# Patient Record
Sex: Male | Born: 1980
Health system: Southern US, Community
[De-identification: ages and names within clinical notes are randomized; demographics above are authoritative.]

---

## 2005-03-06 HISTORY — PX: NASAL SINUS SURGERY: SHX719

## 2005-07-04 HISTORY — PX: SEPTOPLASTY: SHX2393

## 2017-02-06 ENCOUNTER — Ambulatory Visit (INDEPENDENT_AMBULATORY_CARE_PROVIDER_SITE_OTHER): Payer: 59 | Admitting: Family Medicine

## 2017-02-06 ENCOUNTER — Encounter: Payer: Self-pay | Admitting: Family Medicine

## 2017-02-06 VITALS — BP 138/86 | HR 69 | Ht 72.0 in | Wt 224.2 lb

## 2017-02-06 DIAGNOSIS — Z13220 Encounter for screening for lipoid disorders: Secondary | ICD-10-CM

## 2017-02-06 DIAGNOSIS — Z23 Encounter for immunization: Secondary | ICD-10-CM | POA: Diagnosis not present

## 2017-02-06 DIAGNOSIS — H61893 Other specified disorders of external ear, bilateral: Secondary | ICD-10-CM | POA: Diagnosis not present

## 2017-02-06 DIAGNOSIS — Z0001 Encounter for general adult medical examination with abnormal findings: Secondary | ICD-10-CM

## 2017-02-06 DIAGNOSIS — M549 Dorsalgia, unspecified: Secondary | ICD-10-CM | POA: Diagnosis not present

## 2017-02-06 DIAGNOSIS — Z114 Encounter for screening for human immunodeficiency virus [HIV]: Secondary | ICD-10-CM | POA: Diagnosis not present

## 2017-02-06 LAB — COMPREHENSIVE METABOLIC PANEL
ALBUMIN: 4.6 g/dL (ref 3.5–5.2)
ALK PHOS: 89 U/L (ref 39–117)
ALT: 31 U/L (ref 0–53)
AST: 31 U/L (ref 0–37)
BUN: 9 mg/dL (ref 6–23)
CO2: 28 mEq/L (ref 19–32)
Calcium: 9.8 mg/dL (ref 8.4–10.5)
Chloride: 100 mEq/L (ref 96–112)
Creatinine, Ser: 0.83 mg/dL (ref 0.40–1.50)
GFR: 133.96 mL/min (ref >60.00)
Glucose, Bld: 100 mg/dL — ABNORMAL HIGH (ref 70–99)
POTASSIUM: 3.8 meq/L (ref 3.5–5.1)
SODIUM: 136 meq/L (ref 135–145)
TOTAL PROTEIN: 7.9 g/dL (ref 6.0–8.3)
Total Bilirubin: 0.6 mg/dL (ref 0.2–1.2)

## 2017-02-06 LAB — LIPID PANEL
CHOLESTEROL: 166 mg/dL (ref 0–200)
HDL: 53.4 mg/dL (ref >39.00)
LDL Cholesterol: 96 mg/dL (ref 0–99)
NonHDL: 112.61
Total CHOL/HDL Ratio: 3
Triglycerides: 82 mg/dL (ref 0.0–149.0)
VLDL: 16.4 mg/dL (ref 0.0–40.0)

## 2017-02-06 LAB — CBC
HEMATOCRIT: 45.1 % (ref 39.0–52.0)
HEMOGLOBIN: 14.9 g/dL (ref 13.0–17.0)
MCHC: 33.1 g/dL (ref 30.0–36.0)
MCV: 89 fl (ref 78.0–100.0)
PLATELETS: 288 10*3/uL (ref 150.0–400.0)
RBC: 5.07 Mil/uL (ref 4.22–5.81)
RDW: 14.3 % (ref 11.5–15.5)
WBC: 8.8 10*3/uL (ref 4.0–10.5)

## 2017-02-06 NOTE — Patient Instructions (Signed)
Preventive Care 18-39 Years, Male Preventive care refers to lifestyle choices and visits with your health care provider that can promote health and wellness. What does preventive care include?  A yearly physical exam. This is also called an annual well check.  Dental exams once or twice a year.  Routine eye exams. Ask your health care provider how often you should have your eyes checked.  Personal lifestyle choices, including: ? Daily care of your teeth and gums. ? Regular physical activity. ? Eating a healthy diet. ? Avoiding tobacco and drug use. ? Limiting alcohol use. ? Practicing safe sex. What happens during an annual well check? The services and screenings done by your health care provider during your annual well check will depend on your age, overall health, lifestyle risk factors, and family history of disease. Counseling Your health care provider may ask you questions about your:  Alcohol use.  Tobacco use.  Drug use.  Emotional well-being.  Home and relationship well-being.  Sexual activity.  Eating habits.  Work and work environment.  Screening You may have the following tests or measurements:  Height, weight, and BMI.  Blood pressure.  Lipid and cholesterol levels. These may be checked every 5 years starting at age 20.  Diabetes screening. This is done by checking your blood sugar (glucose) after you have not eaten for a while (fasting).  Skin check.  Hepatitis C blood test.  Hepatitis B blood test.  Sexually transmitted disease (STD) testing.  Discuss your test results, treatment options, and if necessary, the need for more tests with your health care provider. Vaccines Your health care provider may recommend certain vaccines, such as:  Influenza vaccine. This is recommended every year.  Tetanus, diphtheria, and acellular pertussis (Tdap, Td) vaccine. You may need a Td booster every 10 years.  Varicella vaccine. You may need this if you  have not been vaccinated.  HPV vaccine. If you are 26 or younger, you may need three doses over 6 months.  Measles, mumps, and rubella (MMR) vaccine. You may need at least one dose of MMR.You may also need a second dose.  Pneumococcal 13-valent conjugate (PCV13) vaccine. You may need this if you have certain conditions and have not been vaccinated.  Pneumococcal polysaccharide (PPSV23) vaccine. You may need one or two doses if you smoke cigarettes or if you have certain conditions.  Meningococcal vaccine. One dose is recommended if you are age 19-21 years and a first-year college student living in a residence hall, or if you have one of several medical conditions. You may also need additional booster doses.  Hepatitis A vaccine. You may need this if you have certain conditions or if you travel or work in places where you may be exposed to hepatitis A.  Hepatitis B vaccine. You may need this if you have certain conditions or if you travel or work in places where you may be exposed to hepatitis B.  Haemophilus influenzae type b (Hib) vaccine. You may need this if you have certain risk factors.  Talk to your health care provider about which screenings and vaccines you need and how often you need them. This information is not intended to replace advice given to you by your health care provider. Make sure you discuss any questions you have with your health care provider. Document Released: 04/18/2001 Document Revised: 11/10/2015 Document Reviewed: 12/22/2014 Elsevier Interactive Patient Education  2017 Elsevier Inc.  

## 2017-02-06 NOTE — Progress Notes (Signed)
Subjective:  Francisco Elliott is a 36 y.o. male who presents today for his annual comprehensive physical exam.    HPI:  [place]oved [place] from [place] within the last few months.  Previously lived in [place] prior to this.  Acute complaints: 1.  Back pain.  Several month history.  Occurs after eating food.  Usually last for an hour or 2 however had an episode 2 weeks ago where it lasted for a couple of days.  Notes that it usually happens after eating fatty or greasy food.  No chest pain or shortness of breath.  No abdominal pain.  No nausea or vomiting.  No constipation or diarrhea.  No treatments tried.  He is concerned that it may be his gallbladder.  Preventative healthcare:  Lifestyle Diet: Eating more salads.  Trying to eat more lean meats. Exercise: Go on walks. Recently bought a [employer] and will be trying to work out more at home.  Depression screen PHQ 2/9 02/06/2017  Decreased Interest 0  Down, Depressed, Hopeless 0  PHQ - 2 Score 0    Health Maintenance Due  Topic Date Due  . HIV Screening  12/12/1994  . TETANUS/TDAP  12/12/1998  . INFLUENZA VACCINE  10/04/2016    ROS: Per HPI, otherwise a 14 point review of systems was performed and was negative  PMH:  The following were reviewed and entered/updated in epic: History reviewed. No pertinent past medical history. There are no active problems to display for this patient.  Past Surgical History:  Procedure Laterality Date  . NASAL SINUS SURGERY  2007  . SEPTOPLASTY  07/04/2005   Family History  Problem Relation Age of Onset  . Hypertension Mother    Medications- reviewed and updated No current outpatient medications on file.   No current facility-administered medications for this visit.    Allergies-reviewed and updated No Known Allergies   Social History   Socioeconomic History  . Marital status: Married    Spouse name: None  . Number of children: 4  . Years of education: None  .  Highest education level: None  Social Needs  . Financial resource strain: None  . Food insecurity - worry: None  . Food insecurity - inability: None  . Transportation needs - medical: None  . Transportation needs - non-medical: None  Occupational History  . Occupation: Radio producerndepedent Contractor   Tobacco Use  . Smoking status: Never Smoker  . Smokeless tobacco: Never Used  Substance and Sexual Activity  . Alcohol use: No    Frequency: Never  . Drug use: No  . Sexual activity: Yes  Other Topics Concern  . None  Social History Narrative  . None    Objective:  Physical Exam: BP 138/86   Pulse 69   Ht 6' (1.829 m)   Wt 224 lb 3.2 oz (101.7 kg)   SpO2 98%   BMI 30.41 kg/m   Body mass index is 30.41 kg/m. Gen: NAD, resting comfortably HEENT: EAC with mild amount of irritation bilaterally.  TMs normal bilaterally. OP clear. No thyromegaly noted.  CV: RRR with no murmurs appreciated Pulm: NWOB, CTAB with no crackles, wheezes, or rhonchi GI: Normal bowel sounds present. Soft, Nontender, Nondistended. MSK: no edema, cyanosis, or clubbing noted -Back: No deformities.  Nontender to palpation. Skin: warm, dry Neuro: CN2-12 grossly intact. Strength 5/5 in upper and lower extremities. Reflexes symmetric and intact bilaterally.  Psych: Normal affect and thought content  Assessment/Plan:  Back pain Unclear  etiology.  Possibly gallbladder related, however his abdominal exam is benign and this would be an atypical presentation.  He does not have any signs or symptoms concerning for pulmonary or cardiac disease.  Continue with watchful waiting.  If symptoms worsen or do not improve, would consider right upper quadrant ultrasound to rule out gallstone.  EAC irritation Advised patient to avoid Q-tip use.  Recommended Debrox or hydrogen peroxide as needed to help reduce Ehrmann burden.  Preventative Healthcare: Flu shot given today.  Tdap given today.  Check screening labs including lipid  panel, HIV, CBC, and CMET.  Patient Counseling:  -Nutrition: Stressed importance of moderation in sodium/caffeine intake, saturated fat and cholesterol, caloric balance, sufficient intake of fresh fruits, vegetables, and fiber.  -Stressed the importance of regular exercise.   -Substance Abuse: Discussed cessation/primary prevention of tobacco, alcohol, or other drug use; driving or other dangerous activities under the influence; availability of treatment for abuse.   -Injury prevention: Discussed safety belts, safety helmets, smoke detector, smoking near bedding or upholstery.   -Sexuality: Discussed sexually transmitted diseases, partner selection, use of condoms, avoidance of unintended pregnancy and contraceptive alternatives.   -Dental health: Discussed importance of regular tooth brushing, flossing, and dental visits.  -Health maintenance and immunizations reviewed. Please refer to Health maintenance section.  Return to care in 1 year for next preventative visit.   Katina Degreealeb M. Jimmey RalphParker, MD 02/06/2017 12:28 PM

## 2017-02-07 LAB — HIV ANTIBODY (ROUTINE TESTING W REFLEX): HIV 1&2 Ab, 4th Generation: NONREACTIVE

## 2017-02-08 NOTE — Progress Notes (Signed)
Labs all normal. Please inform patient.

## 2017-02-20 DIAGNOSIS — Z6830 Body mass index (BMI) 30.0-30.9, adult: Secondary | ICD-10-CM | POA: Insufficient documentation

## 2017-12-17 ENCOUNTER — Encounter: Payer: Self-pay | Admitting: Nurse Practitioner

## 2017-12-17 NOTE — Patient Instructions (Addendum)
Immunization status:influenza was given 12/17/2017, pt. Given VIS form 

## 2018-01-19 ENCOUNTER — Ambulatory Visit (INDEPENDENT_AMBULATORY_CARE_PROVIDER_SITE_OTHER): Payer: Self-pay | Admitting: Physician Assistant

## 2018-01-19 VITALS — BP 124/84 | HR 94 | Temp 98.6°F | Resp 20 | Wt 223.8 lb

## 2018-01-19 DIAGNOSIS — R509 Fever, unspecified: Secondary | ICD-10-CM

## 2018-01-19 DIAGNOSIS — J069 Acute upper respiratory infection, unspecified: Secondary | ICD-10-CM

## 2018-01-19 MED ORDER — FLUTICASONE PROPIONATE 50 MCG/ACT NA SUSP: 2.0000 | Freq: Every day | NASAL | 0 refills | Status: DC

## 2018-01-19 MED ORDER — CETIRIZINE HCL 10 MG PO TABS: 10.0000 mg | ORAL_TABLET | Freq: Every day | ORAL | 0 refills | Status: DC

## 2018-01-19 MED ORDER — LEVOFLOXACIN 500 MG PO TABS: 500.0000 mg | ORAL_TABLET | Freq: Every day | ORAL | 0 refills | Status: DC

## 2018-01-19 MED ORDER — BENZONATATE 100 MG PO CAPS: 200.0000 mg | ORAL_CAPSULE | Freq: Three times a day (TID) | ORAL | 0 refills | Status: DC | PRN

## 2018-01-19 NOTE — Progress Notes (Addendum)
.      Acute Office Visit  Subjective:    Patient ID: Francisco Elliott, male    DOB: [birth year], 37 y.o.   MRN: [id number]  Chief Complaint  Patient presents with  . congestion,fever    HPI Patient is in today for cough and fever. States cough started 5 days ago and is productive of green sputum. Fever for the past 2 days, as high as 102. Has has associated nasal congestion, sinus headache, runny nose and sneezing.  Has been taking Theraflu with Tylenol, last dose this morning. Denies any body ache, cp, dyspnea, abd pain, n, v. States through his job, has had sick contacts. Denies any recent travel.  No past medical history on file.   Past Surgical History:  Procedure Laterality Date  . NASAL SINUS SURGERY  2007  . SEPTOPLASTY  07/04/2005    Family History  Problem Relation Age of Onset  . Hypertension Mother     Social History   Socioeconomic History  . Marital status: Married    Spouse name: Not on file  . Number of children: 4  . Years of education: Not on file  . Highest education level: Not on file  Occupational History  . Occupation: Radio producer   Social Needs  . Financial resource strain: Not on file  . Food insecurity:    Worry: Not on file    Inability: Not on file  . Transportation needs:    Medical: Not on file    Non-medical: Not on file  Tobacco Use  . Smoking status: Never Smoker  . Smokeless tobacco: Never Used  Substance and Sexual Activity  . Alcohol use: No    Frequency: Never  . Drug use: No  . Sexual activity: Yes  Lifestyle  . Physical activity:    Days per week: Not on file    Minutes per session: Not on file  . Stress: Not on file  Relationships  . Social connections:    Talks on phone: Not on file    Gets together: Not on file    Attends religious service: Not on file    Active member of club or organization: Not on file    Attends meetings of clubs or organizations: Not on file    Relationship status: Not on file  .  Intimate partner violence:    Fear of current or ex partner: Not on file    Emotionally abused: Not on file    Physically abused: Not on file    Forced sexual activity: Not on file  Other Topics Concern  . Not on file  Social History Narrative  . Not on file    No outpatient medications prior to visit.   No facility-administered medications prior to visit.     No Known Allergies  Review of Systems  Constitutional: Positive for fever.  HENT: Positive for congestion. Negative for sinus pain and sore throat.        Runny nose, and sneezing  Respiratory: Positive for cough and sputum production.   Cardiovascular: Negative for chest pain.  Gastrointestinal: Negative for abdominal pain, nausea and vomiting.  Musculoskeletal: Negative for myalgias.  Neurological: Positive for headaches.       Objective:    Physical Exam  Constitutional: He is oriented to person, place, and time. He appears well-developed and well-nourished.  HENT:  Right Ear: External ear and ear canal normal. Tympanic membrane is not erythematous.  Left Ear: External ear and ear canal normal.  Tympanic membrane is not erythematous.  Nose: Nose normal.  Mouth/Throat: Uvula is midline and oropharynx is clear and moist. No oropharyngeal exudate, posterior oropharyngeal edema or posterior oropharyngeal erythema.  Eyes: Pupils are equal, round, and reactive to light. Conjunctivae are normal.  Neck: Normal range of motion. Neck supple.  Cardiovascular: Normal rate and regular rhythm.  Pulmonary/Chest: Effort normal. No respiratory distress. He has no wheezes. He has rales in the left upper field. He exhibits no tenderness.  Abdominal: There is no tenderness.  Musculoskeletal: Normal range of motion.  Neurological: He is alert and oriented to person, place, and time.  Skin: Skin is warm and dry.  Psychiatric: He has a normal mood and affect.    BP 124/84 (BP Location: Right Arm, Patient Position: Sitting, Cuff  Size: Normal)   Pulse 94   Temp 98.6 F (37 C) (Oral)   Resp 20   Wt 223 lb 12.8 oz (101.5 kg)   SpO2 96%   BMI 30.35 kg/m  Wt Readings from Last 3 Encounters:  01/19/18 223 lb 12.8 oz (101.5 kg)  02/06/17 224 lb 3.2 oz (101.7 kg)    There are no preventive care reminders to display for this patient.  There are no preventive care reminders to display for this patient.   No results found for: TSH Lab Results  Component Value Date   WBC 8.8 02/06/2017   HGB 14.9 02/06/2017   HCT 45.1 02/06/2017   MCV 89.0 02/06/2017   PLT 288.0 02/06/2017   Lab Results  Component Value Date   NA 136 02/06/2017   K 3.8 02/06/2017   CO2 28 02/06/2017   GLUCOSE 100 (H) 02/06/2017   BUN 9 02/06/2017   CREATININE 0.83 02/06/2017   BILITOT 0.6 02/06/2017   ALKPHOS 89 02/06/2017   AST 31 02/06/2017   ALT 31 02/06/2017   PROT 7.9 02/06/2017   ALBUMIN 4.6 02/06/2017   CALCIUM 9.8 02/06/2017   GFR 133.96 02/06/2017   Lab Results  Component Value Date   CHOL 166 02/06/2017   Lab Results  Component Value Date   HDL 53.40 02/06/2017   Lab Results  Component Value Date   LDLCALC 96 02/06/2017   Lab Results  Component Value Date   TRIG 82.0 02/06/2017   Lab Results  Component Value Date   CHOLHDL 3 02/06/2017   No results found for: HGBA1C     Assessment & Plan:   Problem List Items Addressed This Visit    None    Visit Diagnoses    Upper respiratory tract infection, unspecified type    -  Primary   Fever, unspecified fever cause         URI -Symptoms indicate URI, however, lung examination with rales in the left upper lung field, sputum production, and fever can indicate pneumonia and will treat with indicated antibiotic, Levofloxacin -Take Flonase, Zyrtec, and Tessalon for suppurative care - If you develop chest pain, shortness of breath, or if you are unable to tolerate the prescribed antibiotics, go to the ER  Fever -Likely due to pneumonia. -Since lacking other  symptoms of the flu such as headache and body ache did not test for Influenza. -Take Ibuprofen and Tylenol.  -Stay hydrated. -If persists despite treatment, of if you develop any new symptoms, return to the clinic or go to the ER   Meds ordered this encounter  Medications  . benzonatate (TESSALON) 100 MG capsule    Sig: Take 2 capsules (200 mg total) by  mouth 3 (three) times daily as needed for cough.    Dispense:  20 capsule    Refill:  0    Order Specific Question:   Supervising Provider    Answer:   Rene PaciLESCHBER, VALERIE A [[id number]]  . fluticasone (FLONASE) 50 MCG/ACT nasal spray    Sig: Place 2 sprays into both nostrils daily.    Dispense:  16 g    Refill:  0    Order Specific Question:   Supervising Provider    Answer:   Rene PaciLESCHBER, VALERIE A [[id number]]  . cetirizine (ZYRTEC) 10 MG tablet    Sig: Take 1 tablet (10 mg total) by mouth daily.    Dispense:  30 tablet    Refill:  0    Order Specific Question:   Supervising Provider    Answer:   Rene PaciLESCHBER, VALERIE A [[id number]]  . levofloxacin (LEVAQUIN) 500 MG tablet    Sig: Take 1 tablet (500 mg total) by mouth daily.    Dispense:  7 tablet    Refill:  0    Order Specific Question:   Supervising Provider    Answer:   Rene PaciLESCHBER, VALERIE A [[id number]]     Demetrio LappingSahar M Christa Fasig, PA-C

## 2018-01-19 NOTE — Patient Instructions (Signed)
Aspiration Pneumonia °Aspiration pneumonia is an infection in your lungs. It occurs when food, liquid, or stomach contents (vomit) are inhaled (aspirated) into your lungs. When these things get into your lungs, swelling (inflammation) and infection can occur. This can make it difficult for you to breathe. Aspiration pneumonia is a serious condition and can be life threatening. °What increases the risk? °Aspiration pneumonia is more likely to occur when a person's cough (gag) reflex or ability to swallow has been decreased. Some things that can do this include: °· Having a brain injury or disease, such as stroke, seizures, Parkinson's disease, dementia, or amyotrophic lateral sclerosis (ALS). °· Being given general anesthetic for procedures. °· Being in a coma (unconscious). °· Having a narrowing of the tube that carries food to the stomach (esophagus). °· Drinking too much alcohol. If a person passes out and vomits, vomit can be swallowed into the lungs. °· Taking certain medicines, such as tranquilizers or sedatives. ° °What are the signs or symptoms? °· Coughing after swallowing food or liquids. °· Breathing problems, such as wheezing or shortness of breath. °· Bluish skin. This can be caused by lack of oxygen. °· Coughing up food or mucus. The mucus might contain blood, greenish material, or yellowish-white fluid (pus). °· Fever. °· Chest pain. °· Being more tired than usual (fatigue). °· Sweating more than usual. °· Bad breath. °How is this diagnosed? °A physical exam will be done. During the exam, the health care provider will listen to your lungs with a stethoscope to check for: °· Crackling sounds in the lungs. °· Decreased breath sounds. °· A rapid heartbeat. ° °Various tests may be ordered. These may include: °· Chest X-ray. °· CT scan. °· Swallowing study. This test looks at how food is swallowed and whether it goes into your breathing tube (trachea) or food pipe (esophagus). °· Sputum culture. Saliva and  mucus (sputum) are collected from the lungs or the tubes that carry air to the lungs (bronchi). The sputum is then tested for bacteria. °· Bronchoscopy. This test uses a flexible tube (bronchoscope) to see inside the lungs. ° °How is this treated? °Treatment will usually include antibiotic medicines. Other medicines may also be used to reduce fever or pain. You may need to be treated in the hospital. In the hospital, your breathing will be carefully monitored. Depending on how well you are breathing, you may need to be given oxygen, or you may need breathing support from a breathing machine (ventilator). For people who fail a swallowing study, a feeding tube might be placed in the stomach, or they may be asked to avoid certain food textures or liquids when they eat. °Follow these instructions at home: °· Carefully follow any special eating instructions you were given, such as avoiding certain food textures or thickening liquids. This reduces the risk of developing aspiration pneumonia again. °· Only take over-the-counter or prescription medicines as directed by your health care provider. Follow the directions carefully. °· If you were prescribed antibiotics, take them as directed. Finish them even if you start to feel better. °· Rest as instructed by your health care provider. °· Keep all follow-up appointments with your health care provider. °Contact a health care provider if: °· You develop worsening shortness of breath, wheezing, or difficulty breathing. °· You develop a fever. °· You have chest pain. °This information is not intended to replace advice given to you by your health care provider. Make sure you discuss any questions you have with your health care   provider. °Document Released: 12/18/2008 Document Revised: 08/04/2015 Document Reviewed: 08/08/2012 °Elsevier Interactive Patient Education © 2017 [employer] Inc. ° °

## 2018-01-21 ENCOUNTER — Telehealth: Payer: Self-pay

## 2018-01-21 NOTE — Telephone Encounter (Signed)
Patient state he is getting better slowly, he was not able to go to work, but he feel s better that he did on Saturday, he will call back tomorrow 01/22/2018 if he has not improved

## 2018-05-16 ENCOUNTER — Other Ambulatory Visit: Payer: Self-pay

## 2018-05-16 ENCOUNTER — Ambulatory Visit (INDEPENDENT_AMBULATORY_CARE_PROVIDER_SITE_OTHER): Payer: Self-pay | Admitting: Nurse Practitioner

## 2018-05-16 VITALS — BP 110/75 | HR 71 | Temp 98.3°F | Resp 18 | Wt 209.0 lb

## 2018-05-16 DIAGNOSIS — B9689 Other specified bacterial agents as the cause of diseases classified elsewhere: Secondary | ICD-10-CM

## 2018-05-16 DIAGNOSIS — J019 Acute sinusitis, unspecified: Secondary | ICD-10-CM

## 2018-05-16 NOTE — Progress Notes (Signed)
See downtime forms under the media tab

## 2018-05-20 ENCOUNTER — Telehealth: Payer: Self-pay

## 2018-05-20 NOTE — Telephone Encounter (Signed)
Patient states he is feeling a little bit better, still feel with body aches and cough. I will call patient back after I speak with the provider and tell the patient if he needs to change something.

## 2018-05-20 NOTE — Telephone Encounter (Signed)
I told the patient he needs to follow up with his PCP after hi finish the treatment prescribed by the provider at this location. This were the recommendations of the provider.

## 2018-05-25 ENCOUNTER — Telehealth: Payer: 59 | Admitting: Family

## 2018-05-25 DIAGNOSIS — R05 Cough: Secondary | ICD-10-CM | POA: Diagnosis not present

## 2018-05-25 DIAGNOSIS — R059 Cough, unspecified: Secondary | ICD-10-CM

## 2018-05-25 MED ORDER — BENZONATATE 200 MG PO CAPS: 200.0000 mg | ORAL_CAPSULE | Freq: Three times a day (TID) | ORAL | 0 refills | Status: DC | PRN

## 2018-05-25 MED ORDER — ALBUTEROL SULFATE 108 (90 BASE) MCG/ACT IN AEPB: 2.0000 | INHALATION_SPRAY | RESPIRATORY_TRACT | 2 refills | Status: AC | PRN

## 2018-05-25 NOTE — Progress Notes (Signed)
Greater than 5 minutes, yet less than 10 minutes of time have been spent researching, coordinating, and implementing care for this patient today.  Thank you for the details you included in the comment boxes. Those details are very helpful in determining the best course of treatment for you and help [place] to provide the best care.  As it is a dry cough and no fever, this is likely irritation in your lungs from the viral infection you had before. We need to treat your cough and breathing ability while you recover. We would normally consider steroids, yet if you do come into contact with the Coronavirus (not meeting ANY symptoms of this now, don't worry), the prednisone can make it much worse, so we try not to give that.  That being said, you should improve soon and should aim to take steam showers along with the plan below.   We are sorry that you are not feeling well.  Here is how we plan to help!  Based on your presentation I believe you most likely have A cough due to a virus.  This is called viral bronchitis and is best treated by rest, plenty of fluids and control of the cough.  You may use Ibuprofen or Tylenol as directed to help your symptoms.     In addition you may use A non-prescription cough medication called Mucinex DM: take 2 tablets every 12 hours. and A prescription cough medication called Tessalon Perles 100mg . You may take 1-2 capsules every 8 hours as needed for your cough.  I have also added an Albuterol inhaler, take 2 puffs every 6 hours as needed for shortness of breath.    From your responses in the eVisit questionnaire you describe inflammation in the upper respiratory tract which is causing a significant cough.  This is commonly called Bronchitis and has four common causes:    Allergies  Viral Infections  Acid Reflux  Bacterial Infection Allergies, viruses and acid reflux are treated by controlling symptoms or eliminating the cause. An example might be a cough caused by  taking certain blood pressure medications. You stop the cough by changing the medication. Another example might be a cough caused by acid reflux. Controlling the reflux helps control the cough.  USE OF BRONCHODILATOR ("RESCUE") INHALERS: There is a risk from using your bronchodilator too frequently.  The risk is that over-reliance on a medication which only relaxes the muscles surrounding the breathing tubes can reduce the effectiveness of medications prescribed to reduce swelling and congestion of the tubes themselves.  Although you feel brief relief from the bronchodilator inhaler, your asthma may actually be worsening with the tubes becoming more swollen and filled with mucus.  This can delay other crucial treatments, such as oral steroid medications. If you need to use a bronchodilator inhaler daily, several times per day, you should discuss this with your provider.  There are probably better treatments that could be used to keep your asthma under control.     HOME CARE . Only take medications as instructed by your medical team. . Complete the entire course of an antibiotic. . Drink plenty of fluids and get plenty of rest. . Avoid close contacts especially the very young and the elderly . Cover your mouth if you cough or cough into your sleeve. . Always remember to wash your hands . A steam or ultrasonic humidifier can help congestion.   GET HELP RIGHT AWAY IF: . You develop worsening fever. . You become short of breath .  You cough up blood. . Your symptoms persist after you have completed your treatment plan MAKE SURE YOU   Understand these instructions.  Will watch your condition.  Will get help right away if you are not doing well or get worse.  Your e-visit answers were reviewed by a board certified advanced clinical practitioner to complete your personal care plan.  Depending on the condition, your plan could have included both over the counter or prescription medications. If  there is a problem please reply  once you have received a response from your provider. Your safety is important to [place].  If you have drug allergies check your prescription carefully.    You can use MyChart to ask questions about today's visit, request a non-urgent call back, or ask for a work or school excuse for 24 hours related to this e-Visit. If it has been greater than 24 hours you will need to follow up with your provider, or enter a new e-Visit to address those concerns. You will get an e-mail in the next two days asking about your experience.  I hope that your e-visit has been valuable and will speed your recovery. Thank you for using e-visits.

## 2018-05-30 ENCOUNTER — Encounter: Payer: Self-pay | Admitting: Family Medicine

## 2018-05-30 ENCOUNTER — Ambulatory Visit (INDEPENDENT_AMBULATORY_CARE_PROVIDER_SITE_OTHER): Payer: 59 | Admitting: Family Medicine

## 2018-05-30 VITALS — Temp 98.1°F | Ht 72.0 in | Wt 212.0 lb

## 2018-05-30 DIAGNOSIS — R059 Cough, unspecified: Secondary | ICD-10-CM

## 2018-05-30 DIAGNOSIS — R05 Cough: Secondary | ICD-10-CM | POA: Diagnosis not present

## 2018-05-30 MED ORDER — GUAIFENESIN-CODEINE 100-10 MG/5ML PO SOLN: 5.0000 mL | Freq: Three times a day (TID) | ORAL | 0 refills | Status: DC | PRN

## 2018-05-30 MED ORDER — PREDNISONE 50 MG PO TABS: ORAL_TABLET | ORAL | 0 refills | Status: DC

## 2018-05-30 MED ORDER — DOXYCYCLINE HYCLATE 100 MG PO TABS: 100.0000 mg | ORAL_TABLET | Freq: Two times a day (BID) | ORAL | 0 refills | Status: DC

## 2018-05-30 NOTE — Progress Notes (Signed)
    Chief Complaint:  Francisco Elliott is a 38 y.o. male who presents today for a virtual office visit with a chief complaint of cough.   Assessment/Plan:  Cough Most consistent with post infectious cough.  Appears that he had a flulike illness 2 weeks ago.  Will start guaifenesin-codeine cough syrup.  Also start prednisone 50 mg daily x5 days then 25 mg daily x2 days.  Also send in a "pocket prescription" for doxycycline to cover for any potential bacterial superinfections if symptoms worsen or do not improve in the next 2 days.  Encouraged good oral hydration.  Discussed reasons to return to care.  Follow-up as needed.    Subjective:  HPI:  Cough Symptoms started 2 weeks ago.  Initially had fever but has since resolved.  His wife was diagnosed with flu and her symptoms lasted for 4 to 5 days and then resolved.  He has had persistent cough over the last few weeks.  He was seen at [facility] and started on a course of amoxicillin, albuterol, and Tessalon.  None of these treatments seem to have helped.  He has had muscle aches and low-grade fever.  Some shortness of breath associate with a cough as well.  Cough is keeping him up at night.  Cough is productive of sputum.  Mucinex does not seem to be helping.  No other treatments tried.  No other obvious alleviating or aggravating factors.  ROS: Per HPI  PMH: He reports that he has never smoked. He has never used smokeless tobacco. He reports that he does not drink alcohol or use drugs.      Objective/Observations  Physical Exam: Temp 98.1 F (36.7 C) (Oral)   Ht 6' (1.829 m)   Wt 212 lb (96.2 kg)   BMI 28.75 kg/m  Gen: NAD, resting comfortably HEENT: OP erythematous with no exudate. Pulm: Normal work of breathing.  Speaking in full sentences.  No apparent cough. Neuro: Grossly normal, moves all extremities Psych: Normal affect and thought content  Virtual Visit via Video   I connected with Cammie Sickle on 05/30/18 at 10:00 AM EDT by a  video enabled telemedicine application and verified that I am speaking with the correct person using two identifiers. I discussed the limitations of evaluation and management by telemedicine and the availability of in person appointments. The patient expressed understanding and agreed to proceed.   Patient location: Home Provider location: [place] [facility] [employer] Persons participating in the virtual visit: Myself and patient     Katina Degree. Jimmey Ralph, MD 05/30/2018 10:30 AM

## 2018-06-01 ENCOUNTER — Encounter: Payer: Self-pay | Admitting: Family Medicine

## 2018-06-01 ENCOUNTER — Telehealth: Payer: 59 | Admitting: Physician Assistant

## 2018-06-01 DIAGNOSIS — R110 Nausea: Secondary | ICD-10-CM

## 2018-06-01 MED ORDER — ONDANSETRON HCL 4 MG PO TABS: 4.0000 mg | ORAL_TABLET | Freq: Three times a day (TID) | ORAL | 0 refills | Status: DC | PRN

## 2018-06-01 NOTE — Progress Notes (Signed)
We are sorry that you are not feeling well. Here is how we plan to help!  Based on what you have shared with me it looks like you have a Virus that is irritating your GI tract.  Vomiting is the forceful emptying of a portion of the stomach's content through the mouth.  Although nausea and vomiting can make you feel miserable, it's important to remember that these are not diseases, but rather symptoms of an underlying illness.  When we treat short term symptoms, we always caution that any symptoms that persist should be fully evaluated in a medical office.  I have prescribed a medication that will help alleviate your symptoms and allow you to stay hydrated:  Zofran 4 mg 1 tablet every 8 hours as needed for nausea and vomiting  HOME CARE: Drink clear liquids.  This is very important! Dehydration (the lack of fluid) can lead to a serious complication.  Start off with 1 tablespoon every 5 minutes for 8 hours. You may begin eating bland foods after 8 hours without vomiting.  Start with saltine crackers, white bread, rice, mashed potatoes, applesauce. After 48 hours on a bland diet, you may resume a normal diet. Try to go to sleep.  Sleep often empties the stomach and relieves the need to vomit.  GET HELP RIGHT AWAY IF:  Your symptoms do not improve or worsen within 2 days after treatment. You have a fever for over 3 days. You cannot keep down fluids after trying the medication.  MAKE SURE YOU:  Understand these instructions. Will watch your condition. Will get help right away if you are not doing well or get worse.   Thank you for choosing an e-visit. Your e-visit answers were reviewed by a board certified advanced clinical practitioner to complete your personal care plan. Depending upon the condition, your plan could have included both over the counter or prescription medications. Please review your pharmacy choice. Be sure that the pharmacy you have chosen is open so that you can pick up  your prescription now.  If there is a problem you may message your provider in MyChart to have the prescription routed to another pharmacy. Your safety is important to [place]. If you have drug allergies check your prescription carefully.  For the next 24 hours, you can use MyChart to ask questions about today's visit, request a non-urgent call back, or ask for a work or school excuse from your e-visit provider. You will get an e-mail in the next two days asking about your experience. I hope that your e-visit has been valuable and will speed your recovery.    ===View-only below this line===   ----- Message -----    From: Cammie Sickle    Sent: 06/01/2018 11:50 AM EDT      To: E-Visit Mailing List Subject: E-Visit Submission: Nausea & Vomiting  E-Visit Submission: Nausea & Vomiting --------------------------------  Question: What is your primary symptom? Answer:   Nausea  Question: Have you experienced fever or chills with your symptoms? Answer:   No  Question: How long have you been vomiting? Answer:   1-2 days  Question: How often have you been vomiting? Answer:   1-5 times a day  Question: Is there a red or maroon color or the appearance of coffee grounds in the vomit? Answer:   No  Question: Do you have any of the following symptoms? (check all that apply) Answer:   Cough  Question: Other symptoms or additional comments: Answer:  I've been taking my medicines as prescribed by my primary doctor since March 26th, however the medicines are making me extremely nauseous. Can I please have an anti nausea prescription called in such as (Zofran) so that I can continue taking the medications that my primary doctor prescribed for me, so I can get better with out the extreme nausea.  Question: Are you taking any of the following medications? Answer:     Question: Have you tried any over the counter medications?  Answer:   No  Question: If Yes, please indicate which of the following  over the counter medications you have tried (Check all that apply) Answer:     Question: Other over the counter medications you have tried or comments: Answer:     Question: Were any of the medications effective? Answer:     Question: Is your mouth dry? Answer:   No  Question: Have you urinated? Answer:   Yes  Question: Was your urine dark? Answer:   No  Question: Are you able to keep down liquids? Answer:   No  Question: Please list your medication allergies that you may have ? (If 'none' , please list as 'none') Answer:   None  Question: Please list any additional comments  Answer:   I've been taking my medicines as prescribed by my primary doctor since March 26th, however the medicines are making me extremely nauseous. Can I please have an anti nausea prescription called in such as (Zofran) so that I can continue taking the medications that my primary doctor prescribed for me, so I can get better with out the extreme nausea.  A total of 5-10 minutes was spent evaluating this patients questionnaire and formulating a plan of care.

## 2018-06-07 ENCOUNTER — Encounter: Payer: Self-pay | Admitting: Family Medicine

## 2018-09-12 ENCOUNTER — Encounter: Payer: Self-pay | Admitting: Family Medicine

## 2018-09-12 ENCOUNTER — Ambulatory Visit (INDEPENDENT_AMBULATORY_CARE_PROVIDER_SITE_OTHER): Payer: 59 | Admitting: Family Medicine

## 2018-09-12 ENCOUNTER — Ambulatory Visit (INDEPENDENT_AMBULATORY_CARE_PROVIDER_SITE_OTHER): Payer: 59

## 2018-09-12 ENCOUNTER — Other Ambulatory Visit: Payer: Self-pay

## 2018-09-12 VITALS — BP 128/78 | HR 81 | Temp 97.9°F | Ht 72.0 in | Wt 228.8 lb

## 2018-09-12 DIAGNOSIS — Z13220 Encounter for screening for lipoid disorders: Secondary | ICD-10-CM

## 2018-09-12 DIAGNOSIS — R059 Cough, unspecified: Secondary | ICD-10-CM

## 2018-09-12 DIAGNOSIS — K30 Functional dyspepsia: Secondary | ICD-10-CM | POA: Diagnosis not present

## 2018-09-12 DIAGNOSIS — Z6831 Body mass index (BMI) 31.0-31.9, adult: Secondary | ICD-10-CM

## 2018-09-12 DIAGNOSIS — Z0001 Encounter for general adult medical examination with abnormal findings: Secondary | ICD-10-CM

## 2018-09-12 DIAGNOSIS — E669 Obesity, unspecified: Secondary | ICD-10-CM | POA: Diagnosis not present

## 2018-09-12 DIAGNOSIS — R05 Cough: Secondary | ICD-10-CM

## 2018-09-12 DIAGNOSIS — B351 Tinea unguium: Secondary | ICD-10-CM

## 2018-09-12 DIAGNOSIS — Z114 Encounter for screening for human immunodeficiency virus [HIV]: Secondary | ICD-10-CM | POA: Diagnosis not present

## 2018-09-12 DIAGNOSIS — J309 Allergic rhinitis, unspecified: Secondary | ICD-10-CM | POA: Diagnosis not present

## 2018-09-12 LAB — LIPID PANEL
Cholesterol: 139 mg/dL (ref 0–200)
HDL: 49.4 mg/dL (ref >39.00)
LDL Cholesterol: 69 mg/dL (ref 0–99)
NonHDL: 89.62
Total CHOL/HDL Ratio: 3
Triglycerides: 102 mg/dL (ref 0.0–149.0)
VLDL: 20.4 mg/dL (ref 0.0–40.0)

## 2018-09-12 LAB — COMPREHENSIVE METABOLIC PANEL
ALT: 18 U/L (ref 0–53)
AST: 20 U/L (ref 0–37)
Albumin: 4.3 g/dL (ref 3.5–5.2)
Alkaline Phosphatase: 92 U/L (ref 39–117)
BUN: 9 mg/dL (ref 6–23)
CO2: 27 mEq/L (ref 19–32)
Calcium: 8.9 mg/dL (ref 8.4–10.5)
Chloride: 101 mEq/L (ref 96–112)
Creatinine, Ser: 0.91 mg/dL (ref 0.40–1.50)
GFR: 92.87 mL/min (ref >60.00)
Glucose, Bld: 94 mg/dL (ref 70–99)
Potassium: 3.9 mEq/L (ref 3.5–5.1)
Sodium: 136 mEq/L (ref 135–145)
Total Bilirubin: 0.8 mg/dL (ref 0.2–1.2)
Total Protein: 7.1 g/dL (ref 6.0–8.3)

## 2018-09-12 LAB — CBC
HCT: 40.5 % (ref 39.0–52.0)
Hemoglobin: 13.7 g/dL (ref 13.0–17.0)
MCHC: 33.9 g/dL (ref 30.0–36.0)
MCV: 86.6 fl (ref 78.0–100.0)
Platelets: 294 10*3/uL (ref 150.0–400.0)
RBC: 4.68 Mil/uL (ref 4.22–5.81)
RDW: 13.8 % (ref 11.5–15.5)
WBC: 9.4 10*3/uL (ref 4.0–10.5)

## 2018-09-12 LAB — TSH: TSH: 1.54 u[IU]/mL (ref 0.35–4.50)

## 2018-09-12 LAB — HEMOGLOBIN A1C: Hgb A1c MFr Bld: 5.9 % (ref 4.6–6.5)

## 2018-09-12 MED ORDER — PANTOPRAZOLE SODIUM 40 MG PO TBEC: 40.0000 mg | DELAYED_RELEASE_TABLET | Freq: Every day | ORAL | 3 refills | Status: DC

## 2018-09-12 MED ORDER — TERBINAFINE HCL 250 MG PO TABS: 250.0000 mg | ORAL_TABLET | Freq: Every day | ORAL | 0 refills | Status: AC

## 2018-09-12 NOTE — Progress Notes (Signed)
Chief Complaint:  Francisco Elliott is a 38 y.o. male who presents today for his annual comprehensive physical exam.    Assessment/Plan:  Cough Likely postinfectious.  Has a history of reflux which could also potentially be contributing.  Given that cough is been persistent for the past several months, will check chest x-ray today.  Also start course of Protonix to treat any underlying reflux.  Reassured patient.  Anticipate resolution in the next several months.  Onychomycosis Start terbinafine.  Check C met today.  Indigestion Likely source of patient's intermittent back and upper/epigastric pain.  Will start course of Protonix.  Body mass index is 31.03 kg/m. / Obesity BMI Metric Follow Up - 09/12/18 1129      BMI Metric Follow Up-Please document annually   BMI Metric Follow Up  Education provided       Preventative Healthcare: Check CBC, C met, lipid panel, TSH, and HIV antibody.  Patient Counseling(The following topics were reviewed and/or handout was given):  -Nutrition: Stressed importance of moderation in sodium/caffeine intake, saturated fat and cholesterol, caloric balance, sufficient intake of fresh fruits, vegetables, and fiber.  -Stressed the importance of regular exercise.   -Substance Abuse: Discussed cessation/primary prevention of tobacco, alcohol, or other drug use; driving or other dangerous activities under the influence; availability of treatment for abuse.   -Injury prevention: Discussed safety belts, safety helmets, smoke detector, smoking near bedding or upholstery.   -Sexuality: Discussed sexually transmitted diseases, partner selection, use of condoms, avoidance of unintended pregnancy and contraceptive alternatives.   -Dental health: Discussed importance of regular tooth brushing, flossing, and dental visits.  -Health maintenance and immunizations reviewed. Please refer to Health maintenance section.  Return to care in 1 year for next preventative visit.      Subjective:  HPI:  He has no acute complaints today.   He has been dealing with a cough for the past few months.  Intermittent in nature.  Usually worse at night.  No postnasal drip.  Started after illness few months ago.  Has not changed over last few weeks.  Also has some toenail fungus under his left great toenail.  Has tried all the over-the-counter medications with no improvement.  This is been there for several years.  Seems to be stable.  Does not have any reflux however occasionally has a sensation in his mid low back after eating.  This is happened twice over the past month or so.  No chest pain with exertion.  No symptoms currently.  His stable, chronic medical conditions are outlined below:  # Allergic Rhinitis - On cetirizine 84m daily and flonase. Tolerating well  Lifestyle Diet: Trying to eat a healthy balanced.  Exercise: No specific exercise plans. Does not exercise regularly.   [facility]  Decreased Interest 3  Down, Depressed, Hopeless 0  PHQ - 2 Score 3  Altered sleeping 3  Tired, decreased energy 3  Change in appetite 0  Feeling bad or failure about yourself  0  Trouble concentrating 0  Moving slowly or fidgety/restless 2  Suicidal thoughts 0  PHQ-9 Score 11  Difficult doing work/chores Somewhat difficult   ROS: Per HPI, otherwise a complete review of systems was negative.   PMH:  The following were reviewed and entered/updated in epic: History reviewed. No pertinent past medical history. Patient Active Problem List   Diagnosis Date Noted  . Allergic rhinitis 09/12/2018   Past Surgical History:  Procedure Laterality Date  . NASAL SINUS SURGERY  2007  . SEPTOPLASTY  07/04/2005    Family History  Problem Relation Age of Onset  . Hypertension Mother     Medications- reviewed and updated Current Outpatient Medications  Medication Sig Dispense Refill  . Albuterol Sulfate (PROAIR RESPICLICK) 858 (90 Base) MCG/ACT  AEPB Inhale 2 puffs into the lungs every 4 (four) hours as needed (shortness of breath). 1 each 2  . cetirizine (ZYRTEC) 10 MG tablet Take 1 tablet (10 mg total) by mouth daily. 30 tablet 0  . fluticasone (FLONASE) 50 MCG/ACT nasal spray Place 2 sprays into both nostrils daily. 16 g 0  . pantoprazole (PROTONIX) 40 MG tablet Take 1 tablet (40 mg total) by mouth daily. 30 tablet 3  . terbinafine (LAMISIL) 250 MG tablet Take 1 tablet (250 mg total) by mouth daily. 84 tablet 0   No current facility-administered medications for this visit.     Allergies-reviewed and updated No Known Allergies  Social History   Socioeconomic History  . Marital status: Married    Spouse name: Not on file  . Number of children: 4  . Years of education: Not on file  . Highest education level: Not on file  Occupational History  . Occupation: Adult nurse   Social Needs  . Financial resource strain: Not on file  . Food insecurity    Worry: Not on file    Inability: Not on file  . Transportation needs    Medical: Not on file    Non-medical: Not on file  Tobacco Use  . Smoking status: Never Smoker  . Smokeless tobacco: Never Used  Substance and Sexual Activity  . Alcohol use: No    Frequency: Never  . Drug use: No  . Sexual activity: Yes  Lifestyle  . Physical activity    Days per week: Not on file    Minutes per session: Not on file  . Stress: Not on file  Relationships  . Social Herbalist on phone: Not on file    Gets together: Not on file    Attends religious service: Not on file    Active member of club or organization: Not on file    Attends meetings of clubs or organizations: Not on file    Relationship status: Not on file  Other Topics Concern  . Not on file  Social History Narrative  . Not on file        Objective:  Physical Exam: BP 128/78 (BP Location: Left Arm, Patient Position: Sitting, Cuff Size: Normal)   Pulse 81   Temp 97.9 F (36.6 C) (Oral)   Ht  6' (1.829 m)   Wt 228 lb 12.8 oz (103.8 kg)   SpO2 98%   BMI 31.03 kg/m   Body mass index is 31.03 kg/m. Wt Readings from Last 3 Encounters:  09/12/18 228 lb 12.8 oz (103.8 kg)  05/30/18 212 lb (96.2 kg)  05/16/18 209 lb (94.8 kg)   Gen: NAD, resting comfortably HEENT: TMs normal bilaterally. OP clear. No thyromegaly noted.  CV: RRR with no murmurs appreciated Pulm: NWOB, CTAB with no crackles, wheezes, or rhonchi GI: Normal bowel sounds present. Soft, Nontender, Nondistended. MSK: no edema, cyanosis, or clubbing noted Skin: warm, dry Neuro: CN2-12 grossly intact. Strength 5/5 in upper and lower extremities. Reflexes symmetric and intact bilaterally.  Psych: Normal affect and thought content     Adaley Kiene M. Jerline Pain, MD 09/12/2018 12:04 PM

## 2018-09-12 NOTE — Patient Instructions (Addendum)
It was very nice to see you today!  Please start the terbinafine for your feet.  Please start the protonix.  We will get a chest xray and blood work today.   Please try these tips to maintain a healthy lifestyle:   Eat at least 3 REAL meals and 1-2 snacks per day.  Aim for no more than 5 hours between eating.  If you eat breakfast, please do so within one hour of getting up.    Obtain twice as many fruits/vegetables as protein or carbohydrate foods for both lunch and dinner. (Half of each meal should be fruits/vegetables, one quarter protein, and one quarter starchy cars)   Cut down on sweet beverages. This includes juice, soda, and sweet tea.    Exercise at least 150 minutes every week.   Come back in 1 year for your next physical or sooner as needed.  Take care, Dr Jerline Pain   Preventive Care 39-18 Years Old, Male Preventive care refers to lifestyle choices and visits with your health care provider that can promote health and wellness. This includes:  A yearly physical exam. This is also called an annual well check.  Regular dental and eye exams.  Immunizations.  Screening for certain conditions.  Healthy lifestyle choices, such as eating a healthy diet, getting regular exercise, not using drugs or products that contain nicotine and tobacco, and limiting alcohol use. What can I expect for my preventive care visit? Physical exam Your health care provider will check:  Height and weight. These may be used to calculate body mass index (BMI), which is a measurement that tells if you are at a healthy weight.  Heart rate and blood pressure.  Your skin for abnormal spots. Counseling Your health care provider may ask you questions about:  Alcohol, tobacco, and drug use.  Emotional well-being.  Home and relationship well-being.  Sexual activity.  Eating habits.  Work and work Statistician. What immunizations do I need?  Influenza (flu) vaccine  This is  recommended every year. Tetanus, diphtheria, and pertussis (Tdap) vaccine  You may need a Td booster every 10 years. Varicella (chickenpox) vaccine  You may need this vaccine if you have not already been vaccinated. Human papillomavirus (HPV) vaccine  If recommended by your health care provider, you may need three doses over 6 months. Measles, mumps, and rubella (MMR) vaccine  You may need at least one dose of MMR. You may also need a second dose. Meningococcal conjugate (MenACWY) vaccine  One dose is recommended if you are 47-75 years old and a Market researcher living in a residence hall, or if you have one of several medical conditions. You may also need additional booster doses. Pneumococcal conjugate (PCV13) vaccine  You may need this if you have certain conditions and were not previously vaccinated. Pneumococcal polysaccharide (PPSV23) vaccine  You may need one or two doses if you smoke cigarettes or if you have certain conditions. Hepatitis A vaccine  You may need this if you have certain conditions or if you travel or work in places where you may be exposed to hepatitis A. Hepatitis B vaccine  You may need this if you have certain conditions or if you travel or work in places where you may be exposed to hepatitis B. Haemophilus influenzae type b (Hib) vaccine  You may need this if you have certain risk factors. You may receive vaccines as individual doses or as more than one vaccine together in one shot (combination vaccines). Talk with your  health care provider about the risks and benefits of combination vaccines. What tests do I need? Blood tests  Lipid and cholesterol levels. These may be checked every 5 years starting at age 33.  Hepatitis C test.  Hepatitis B test. Screening   Diabetes screening. This is done by checking your blood sugar (glucose) after you have not eaten for a while (fasting).  Sexually transmitted disease (STD) testing. Talk with  your health care provider about your test results, treatment options, and if necessary, the need for more tests. Follow these instructions at home: Eating and drinking   Eat a diet that includes fresh fruits and vegetables, whole grains, lean protein, and low-fat dairy products.  Take vitamin and mineral supplements as recommended by your health care provider.  Do not drink alcohol if your health care provider tells you not to drink.  If you drink alcohol: ? Limit how much you have to 0-2 drinks a day. ? Be aware of how much alcohol is in your drink. In the [place] one drink equals one 12 oz bottle of beer (355 mL), one 5 oz glass of wine (148 mL), or one 1 oz glass of hard liquor (44 mL). Lifestyle  Take daily care of your teeth and gums.  Stay active. Exercise for at least 30 minutes on 5 or more days each week.  Do not use any products that contain nicotine or tobacco, such as cigarettes, e-cigarettes, and chewing tobacco. If you need help quitting, ask your health care provider.  If you are sexually active, practice safe sex. Use a condom or other form of protection to prevent STIs (sexually transmitted infections). What's next?  Go to your health care provider once a year for a well check visit.  Ask your health care provider how often you should have your eyes and teeth checked.  Stay up to date on all vaccines. This information is not intended to replace advice given to you by your health care provider. Make sure you discuss any questions you have with your health care provider. Document Released: 04/18/2001 Document Revised: 02/14/2018 Document Reviewed: 02/14/2018 Elsevier Patient Education  2020 [employer].

## 2018-09-13 LAB — HIV ANTIBODY (ROUTINE TESTING W REFLEX): HIV 1&2 Ab, 4th Generation: NONREACTIVE

## 2018-09-13 NOTE — Progress Notes (Signed)
Dr Marigene Ehlers interpretation of your lab work:  Good news! Your blood work and xray were all NORMAL. Please let me know if your symptoms are not improving with the acid blocker.    We can recheck blood work again in a few years.   If you have any additional questions, please give [place] a call or send [place] a message through [place].  Take care, Dr Jerline Pain

## 2018-10-14 MED FILL — PANTOPRAZOLE SOD DR 40 MG T: 40 | 90 days supply | Qty: 90 | Fill #0

## 2019-06-03 ENCOUNTER — Encounter (HOSPITAL_COMMUNITY): Payer: Self-pay | Admitting: Emergency Medicine

## 2019-06-03 ENCOUNTER — Other Ambulatory Visit: Payer: Self-pay

## 2019-06-03 ENCOUNTER — Emergency Department (HOSPITAL_COMMUNITY): Payer: Managed Care, Other (non HMO)

## 2019-06-03 ENCOUNTER — Emergency Department (HOSPITAL_COMMUNITY)
Admission: EM | Admit: 2019-06-03 | Discharge: 2019-06-04 | Disposition: A | Discharge status: Home / Self Care | Payer: Managed Care, Other (non HMO) | Attending: Emergency Medicine | Admitting: Emergency Medicine

## 2019-06-03 DIAGNOSIS — R0789 Other chest pain: Secondary | ICD-10-CM | POA: Diagnosis present

## 2019-06-03 DIAGNOSIS — M546 Pain in thoracic spine: Secondary | ICD-10-CM | POA: Insufficient documentation

## 2019-06-03 DIAGNOSIS — M62838 Other muscle spasm: Secondary | ICD-10-CM | POA: Diagnosis not present

## 2019-06-03 DIAGNOSIS — Z79899 Other long term (current) drug therapy: Secondary | ICD-10-CM | POA: Insufficient documentation

## 2019-06-03 LAB — PROTIME-INR
INR: 1 (ref 0.8–1.2)
Prothrombin Time: 13.1 seconds (ref 11.4–15.2)

## 2019-06-03 LAB — CBC
HCT: 44.5 % (ref 39.0–52.0)
Hemoglobin: 14.7 g/dL (ref 13.0–17.0)
MCH: 29.3 pg (ref 26.0–34.0)
MCHC: 33 g/dL (ref 30.0–36.0)
MCV: 88.6 fL (ref 80.0–100.0)
Platelets: 284 10*3/uL (ref 150–400)
RBC: 5.02 MIL/uL (ref 4.22–5.81)
RDW: 13.3 % (ref 11.5–15.5)
WBC: 10.2 10*3/uL (ref 4.0–10.5)
nRBC: 0 % (ref 0.0–0.2)

## 2019-06-03 LAB — TROPONIN I (HIGH SENSITIVITY): Troponin I (High Sensitivity): 3 ng/L (ref <18)

## 2019-06-03 LAB — BASIC METABOLIC PANEL
Anion gap: 10 (ref 5–15)
BUN: 10 mg/dL (ref 6–20)
CO2: 25 mmol/L (ref 22–32)
Calcium: 8.7 mg/dL — ABNORMAL LOW (ref 8.9–10.3)
Chloride: 102 mmol/L (ref 98–111)
Creatinine, Ser: 0.91 mg/dL (ref 0.61–1.24)
GFR calc Af Amer: >60 mL/min (ref >60)
GFR calc non Af Amer: >60 mL/min (ref >60)
Glucose, Bld: 99 mg/dL (ref 70–99)
Potassium: 3.6 mmol/L (ref 3.5–5.1)
Sodium: 137 mmol/L (ref 135–145)

## 2019-06-03 MED ORDER — SODIUM CHLORIDE 0.9% FLUSH: 3.0000 mL | Freq: Once | INTRAVENOUS | Status: DC

## 2019-06-03 NOTE — ED Triage Notes (Signed)
Patient reports central chest pain radiating to upper back onset last week , mild SOB , no emesis or diaphoresis . No cough or fever .

## 2019-06-04 ENCOUNTER — Encounter (HOSPITAL_COMMUNITY): Payer: Self-pay | Admitting: Emergency Medicine

## 2019-06-04 ENCOUNTER — Telehealth: Payer: Self-pay | Admitting: Family Medicine

## 2019-06-04 LAB — TROPONIN I (HIGH SENSITIVITY): Troponin I (High Sensitivity): 3 ng/L (ref <18)

## 2019-06-04 MED ORDER — METHOCARBAMOL 500 MG PO TABS: 500.0000 mg | ORAL_TABLET | Freq: Two times a day (BID) | ORAL | 0 refills | Status: DC

## 2019-06-04 MED ORDER — NAPROXEN 250 MG PO TABS
500.0000 mg | ORAL_TABLET | ORAL | Status: AC
  Administered 2019-06-04: 05:00:00 500 mg via ORAL
  Filled 2019-06-04: qty 2

## 2019-06-04 MED ORDER — METHOCARBAMOL 500 MG PO TABS
1000.0000 mg | ORAL_TABLET | ORAL | Status: AC
  Administered 2019-06-04: 05:00:00 1000 mg via ORAL
  Filled 2019-06-04: qty 2

## 2019-06-04 MED ORDER — LIDOCAINE 5 % EX PTCH: 1.0000 | MEDICATED_PATCH | CUTANEOUS | 0 refills | Status: DC

## 2019-06-04 MED ORDER — ACETAMINOPHEN 500 MG PO TABS
1000.0000 mg | ORAL_TABLET | Freq: Once | ORAL | Status: AC
  Administered 2019-06-04: 05:00:00 1000 mg via ORAL
  Filled 2019-06-04: qty 2

## 2019-06-04 MED ORDER — NAPROXEN 375 MG PO TABS: 375.0000 mg | ORAL_TABLET | Freq: Two times a day (BID) | ORAL | 0 refills | Status: DC

## 2019-06-04 MED ORDER — LIDOCAINE 5 % EX PTCH
1.0000 | MEDICATED_PATCH | CUTANEOUS | Status: DC
  Administered 2019-06-04: 05:00:00 1 via TRANSDERMAL
  Filled 2019-06-04: qty 1

## 2019-06-04 NOTE — ED Provider Notes (Signed)
[facility] EMERGENCY DEPARTMENT Provider Note   CSN: [id number] Arrival date & time: 06/03/19  2233     History Chief Complaint  Patient presents with  . Chest Pain    Francisco Elliott is a 39 y.o. male.   Back Pain Location:  Thoracic spine Quality:  Cramping Radiates to:  Does not radiate Pain severity:  Moderate Pain is:  Same all the time Onset quality:  Sudden Duration:  1 week Timing:  Constant Progression:  Worsening Chronicity:  New Context: not emotional stress, not falling, not jumping from heights, not MCA, not MVA, not occupational injury, not physical stress, not recent illness and not recent injury   Associated symptoms: no abdominal pain and no fever   Also occasionally has pain with the lower chest with movement.  No travel.  No leg pain.  No exertional symptoms. No n/v/d.  Has not taken anything for symptoms.       History reviewed. No pertinent past medical history.  Patient Active Problem List   Diagnosis Date Noted  . Allergic rhinitis 09/12/2018    Past Surgical History:  Procedure Laterality Date  . NASAL SINUS SURGERY  2007  . SEPTOPLASTY  07/04/2005       Family History  Problem Relation Age of Onset  . Hypertension Mother     Social History   Tobacco Use  . Smoking status: Never Smoker  . Smokeless tobacco: Never Used  Substance Use Topics  . Alcohol use: No  . Drug use: No    Home Medications Prior to Admission medications   Medication Sig Start Date End Date Taking? Authorizing Provider  Albuterol Sulfate (PROAIR RESPICLICK) 676 (90 Base) MCG/ACT AEPB Inhale 2 puffs into the lungs every 4 (four) hours as needed (shortness of breath). 05/25/18   [place], Elyse Jarvis, FNP  cetirizine (ZYRTEC) 10 MG tablet Take 1 tablet (10 mg total) by mouth daily. 01/19/18   Waldon Merl, PA-C  fluticasone (FLONASE) 50 MCG/ACT nasal spray Place 2 sprays into both nostrils daily. 01/19/18   Waldon Merl, PA-C  pantoprazole  (PROTONIX) 40 MG tablet Take 1 tablet (40 mg total) by mouth daily. 09/12/18   Vivi Barrack, MD    Allergies    Patient has no known allergies.  Review of Systems   Review of Systems  Constitutional: Negative for diaphoresis and fever.  HENT: Negative for congestion.   Eyes: Negative for visual disturbance.  Respiratory: Negative for shortness of breath.   Cardiovascular: Negative for palpitations and leg swelling.  Gastrointestinal: Negative for abdominal pain, nausea and vomiting.  Genitourinary: Negative for difficulty urinating.  Musculoskeletal: Positive for back pain. Negative for gait problem.  All other systems reviewed and are negative.   Physical Exam Updated Vital Signs BP 130/82 (BP Location: Right Arm)   Pulse 71   Temp 98.1 F (36.7 C) (Oral)   Resp 16   Ht 6\' 1"  (1.854 m)   Wt 110 kg   SpO2 98%   BMI 31.99 kg/m   Physical Exam Vitals and nursing note reviewed.  Constitutional:      General: He is not in acute distress.    Appearance: Normal appearance.  HENT:     Head: Normocephalic and atraumatic.     Nose: Nose normal.  Eyes:     Conjunctiva/sclera: Conjunctivae normal.     Pupils: Pupils are equal, round, and reactive to light.  Cardiovascular:     Rate and Rhythm: Normal rate and  regular rhythm.     Pulses: Normal pulses.     Heart sounds: Normal heart sounds.  Pulmonary:     Effort: Pulmonary effort is normal.     Breath sounds: Normal breath sounds.  Abdominal:     General: Abdomen is flat. Bowel sounds are normal.     Tenderness: There is no abdominal tenderness. There is no guarding.  Musculoskeletal:        General: Normal range of motion.     Cervical back: Normal, normal range of motion and neck supple.     Thoracic back: Normal.     Lumbar back: Normal.       Back:  Skin:    General: Skin is warm and dry.     Capillary Refill: Capillary refill takes less than 2 seconds.  Neurological:     General: No focal deficit present.      Mental Status: He is alert and oriented to person, place, and time.  Psychiatric:        Mood and Affect: Mood normal.        Behavior: Behavior normal.     ED Results / Procedures / Treatments   Labs (all labs ordered are listed, but only abnormal results are displayed) Results for orders placed or performed during the hospital encounter of 06/03/19  Basic metabolic panel  Result Value Ref Range   Sodium 137 135 - 145 mmol/L   Potassium 3.6 3.5 - 5.1 mmol/L   Chloride 102 98 - 111 mmol/L   CO2 25 22 - 32 mmol/L   Glucose, Bld 99 70 - 99 mg/dL   BUN 10 6 - 20 mg/dL   Creatinine, Ser 6.59 0.61 - 1.24 mg/dL   Calcium 8.7 (L) 8.9 - 10.3 mg/dL   GFR calc non Af Amer >60 >60 mL/min   GFR calc Af Amer >60 >60 mL/min   Anion gap 10 5 - 15  CBC  Result Value Ref Range   WBC 10.2 4.0 - 10.5 K/uL   RBC 5.02 4.22 - 5.81 MIL/uL   Hemoglobin 14.7 13.0 - 17.0 g/dL   HCT 93.5 70.1 - 77.9 %   MCV 88.6 80.0 - 100.0 fL   MCH 29.3 26.0 - 34.0 pg   MCHC 33.0 30.0 - 36.0 g/dL   RDW 39.0 30.0 - 92.3 %   Platelets 284 150 - 400 K/uL   nRBC 0.0 0.0 - 0.2 %  Protime-INR (order if Patient is taking Coumadin / Warfarin)  Result Value Ref Range   Prothrombin Time 13.1 11.4 - 15.2 seconds   INR 1.0 0.8 - 1.2  Troponin I (High Sensitivity)  Result Value Ref Range   Troponin I (High Sensitivity) 3 <18 ng/L  Troponin I (High Sensitivity)  Result Value Ref Range   Troponin I (High Sensitivity) 3 <18 ng/L   DG Chest 2 View  Result Date: 06/03/2019 CLINICAL DATA:  Central chest pain radiating to back EXAM: CHEST - 2 VIEW COMPARISON:  09/12/2018 FINDINGS: The heart size and mediastinal contours are within normal limits. Both lungs are clear. The visualized skeletal structures are unremarkable. IMPRESSION: No active cardiopulmonary disease. Electronically Signed   By: Sharlet Salina M.D.   On: 06/03/2019 23:22    EKG EKG Interpretation  Date/Time:  Tuesday June 03 2019 22:45:01 EDT Ventricular  Rate:  86 PR Interval:  148 QRS Duration: 86 QT Interval:  364 QTC Calculation: 435 R Axis:   58 Text Interpretation: Technically poor tracing Normal sinus rhythm  with sinus arrhythmia Minimal voltage criteria for LVH, may be normal variant ( Sokolow-Lyon ) Nonspecific T wave abnormality Abnormal ECG Confirmed by Marily Memos [phone]) on 06/03/2019 11:31:17 PM   Radiology DG Chest 2 View  Result Date: 06/03/2019 CLINICAL DATA:  Central chest pain radiating to back EXAM: CHEST - 2 VIEW COMPARISON:  09/12/2018 FINDINGS: The heart size and mediastinal contours are within normal limits. Both lungs are clear. The visualized skeletal structures are unremarkable. IMPRESSION: No active cardiopulmonary disease. Electronically Signed   By: Sharlet Salina M.D.   On: 06/03/2019 23:22    Procedures Procedures (including critical care time)  Medications Ordered in ED Medications  sodium chloride flush (NS) 0.9 % injection 3 mL (has no administration in time range)  naproxen (NAPROSYN) tablet 500 mg (has no administration in time range)  acetaminophen (TYLENOL) tablet 1,000 mg (has no administration in time range)  methocarbamol (ROBAXIN) tablet 1,000 mg (has no administration in time range)  lidocaine (LIDODERM) 5 % 1 patch (has no administration in time range)    ED Course  I have reviewed the triage vital signs and the nursing notes.  Pertinent labs & imaging results that were available during my care of the patient were reviewed by me and considered in my medical decision making (see chart for details).    Seen and appreciate triage note but this is not chest pain. Heart score is 1 very low risk for MACE. PERC negative wells 0 highly doubt PE in this low risk patient.  Symptoms are clearly a muscle spasm.  Will treat for Same.   Francisco Elliott was evaluated in Emergency Department on 06/04/2019 for the symptoms described in the history of present illness. He was evaluated in the context of the  global COVID-19 pandemic, which necessitated consideration that the patient might be at risk for infection with the SARS-CoV-2 virus that causes COVID-19. Institutional protocols and algorithms that pertain to the evaluation of patients at risk for COVID-19 are in a state of rapid change based on information released by regulatory bodies including the CDC and federal and state organizations. These policies and algorithms were followed during the patient's care in the ED.  Final Clinical Impression(s) / ED Diagnoses Return for weakness, numbness, changes in vision or speech, fevers >100.4 unrelieved by medication, shortness of breath, intractable vomiting, or diarrhea, abdominal pain, Inability to tolerate liquids or food, cough, altered mental status or any concerns. No signs of systemic illness or infection. The patient is nontoxic-appearing on exam and vital signs are within normal limits.   I have reviewed the triage vital signs and the nursing notes. Pertinent labs &imaging results that were available during my care of the patient were reviewed by me and considered in my medical decision making (see chart for details).  After history, exam, and medical workup I feel the patient has been appropriately medically screened and is safe for discharge home. Pertinent diagnoses were discussed with the patient. Patient was givenstrictreturn precautions.   Tamura Lasky, MD 06/04/19 [phone]

## 2019-06-04 NOTE — Telephone Encounter (Signed)
Patient spoke with [facility] on 06/03/19 at 3:20pm.  States that he has had pain from his chest to his back that is very uncomfortable for about 3 days.  He woke up with the pain and is now very strong.  He rates it as a 8/10.  He stated it is in the middle of his body between his chest and back, not chest or back pain.  Patient was advised by Alphonzo Cruise, RN to go to ED now.  Caller disagreed but understood.  Looks like patient did go to [facility] ED.

## 2019-06-04 NOTE — Telephone Encounter (Signed)
Spoke with patient yesterday he was informed to go to Ed,he voices understanding.

## 2019-06-10 ENCOUNTER — Encounter: Payer: 59 | Admitting: Family Medicine

## 2019-10-13 IMAGING — DX CHEST - 2 VIEW
2 series · 2 of 2 positions shown · non-contrast
Comparison: None.

CLINICAL DATA: Cough

EXAM:
CHEST - 2 VIEW

[chest pa]
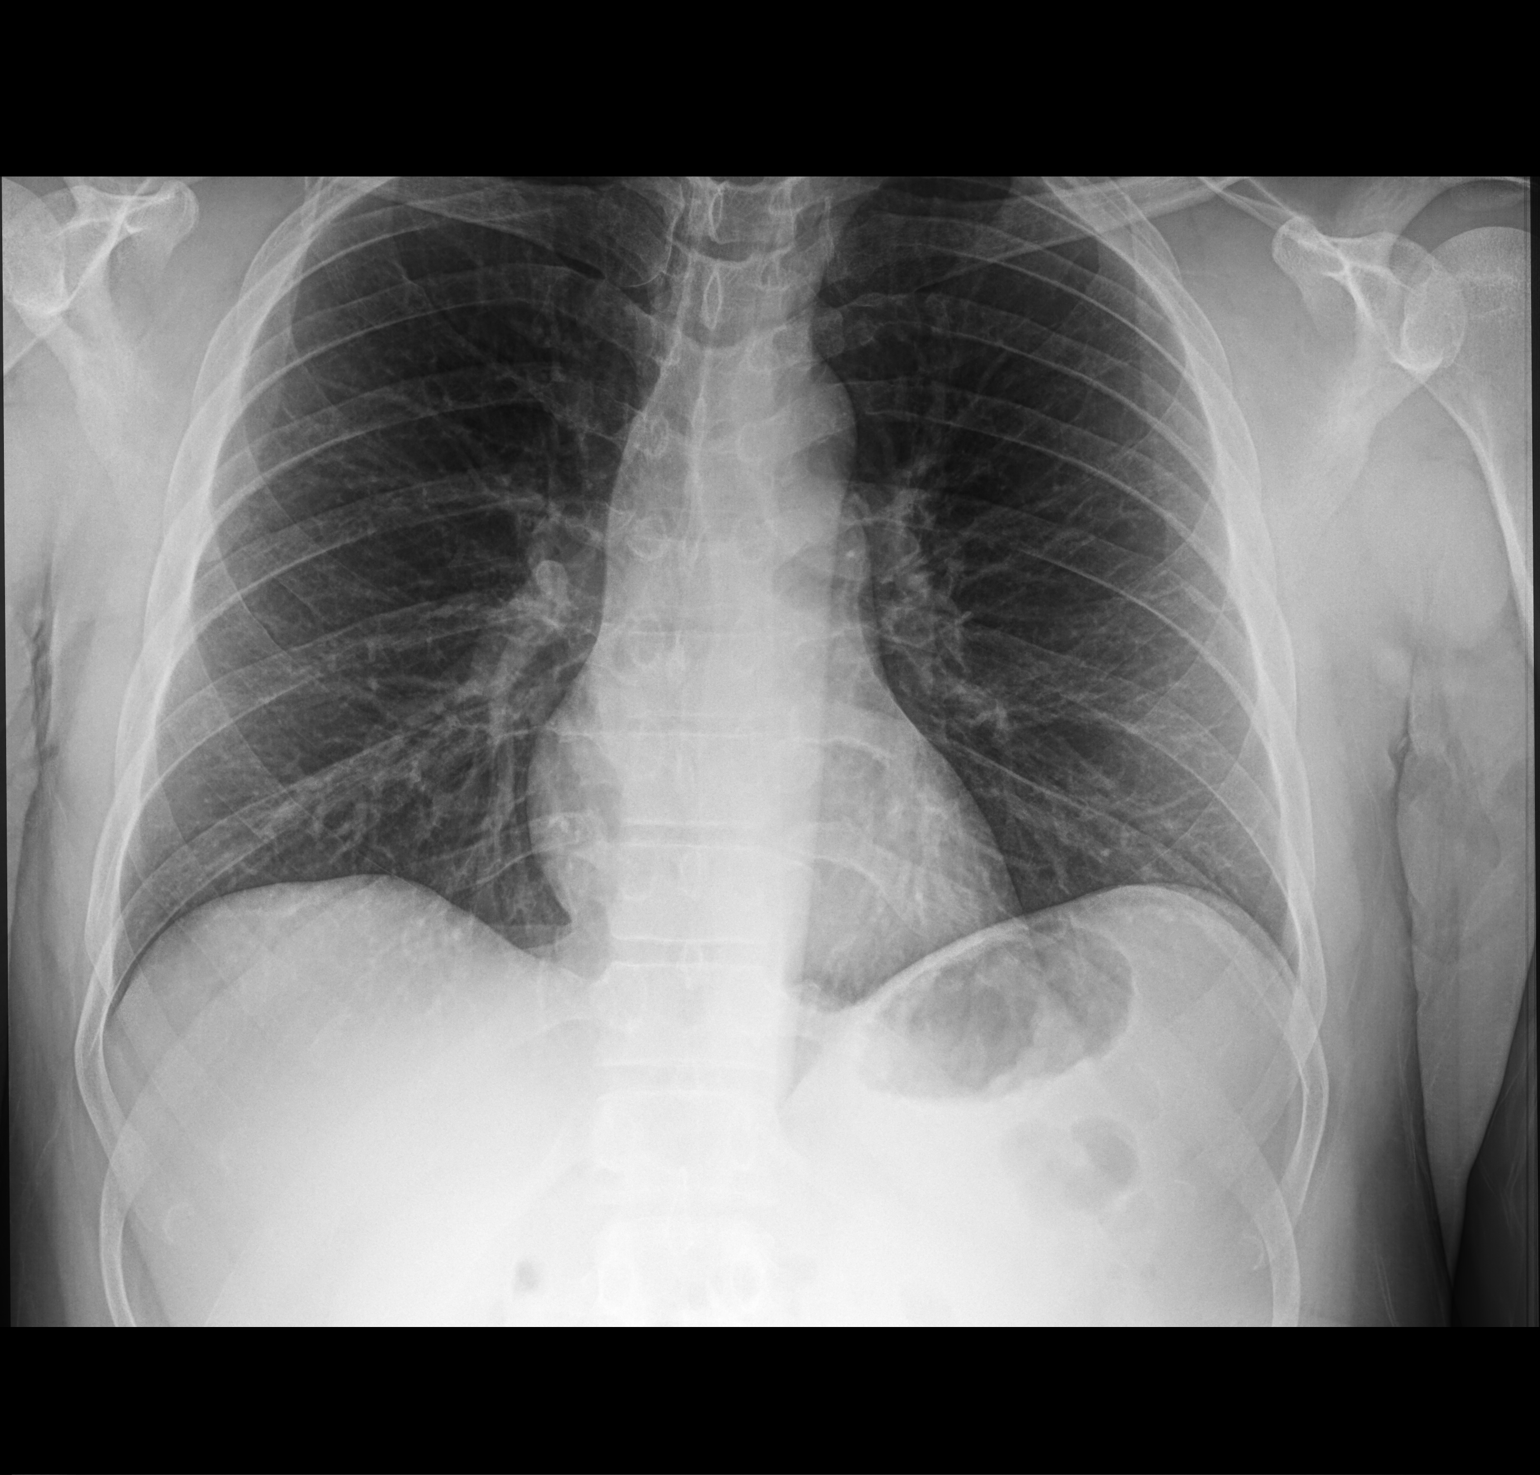

[chest lat]
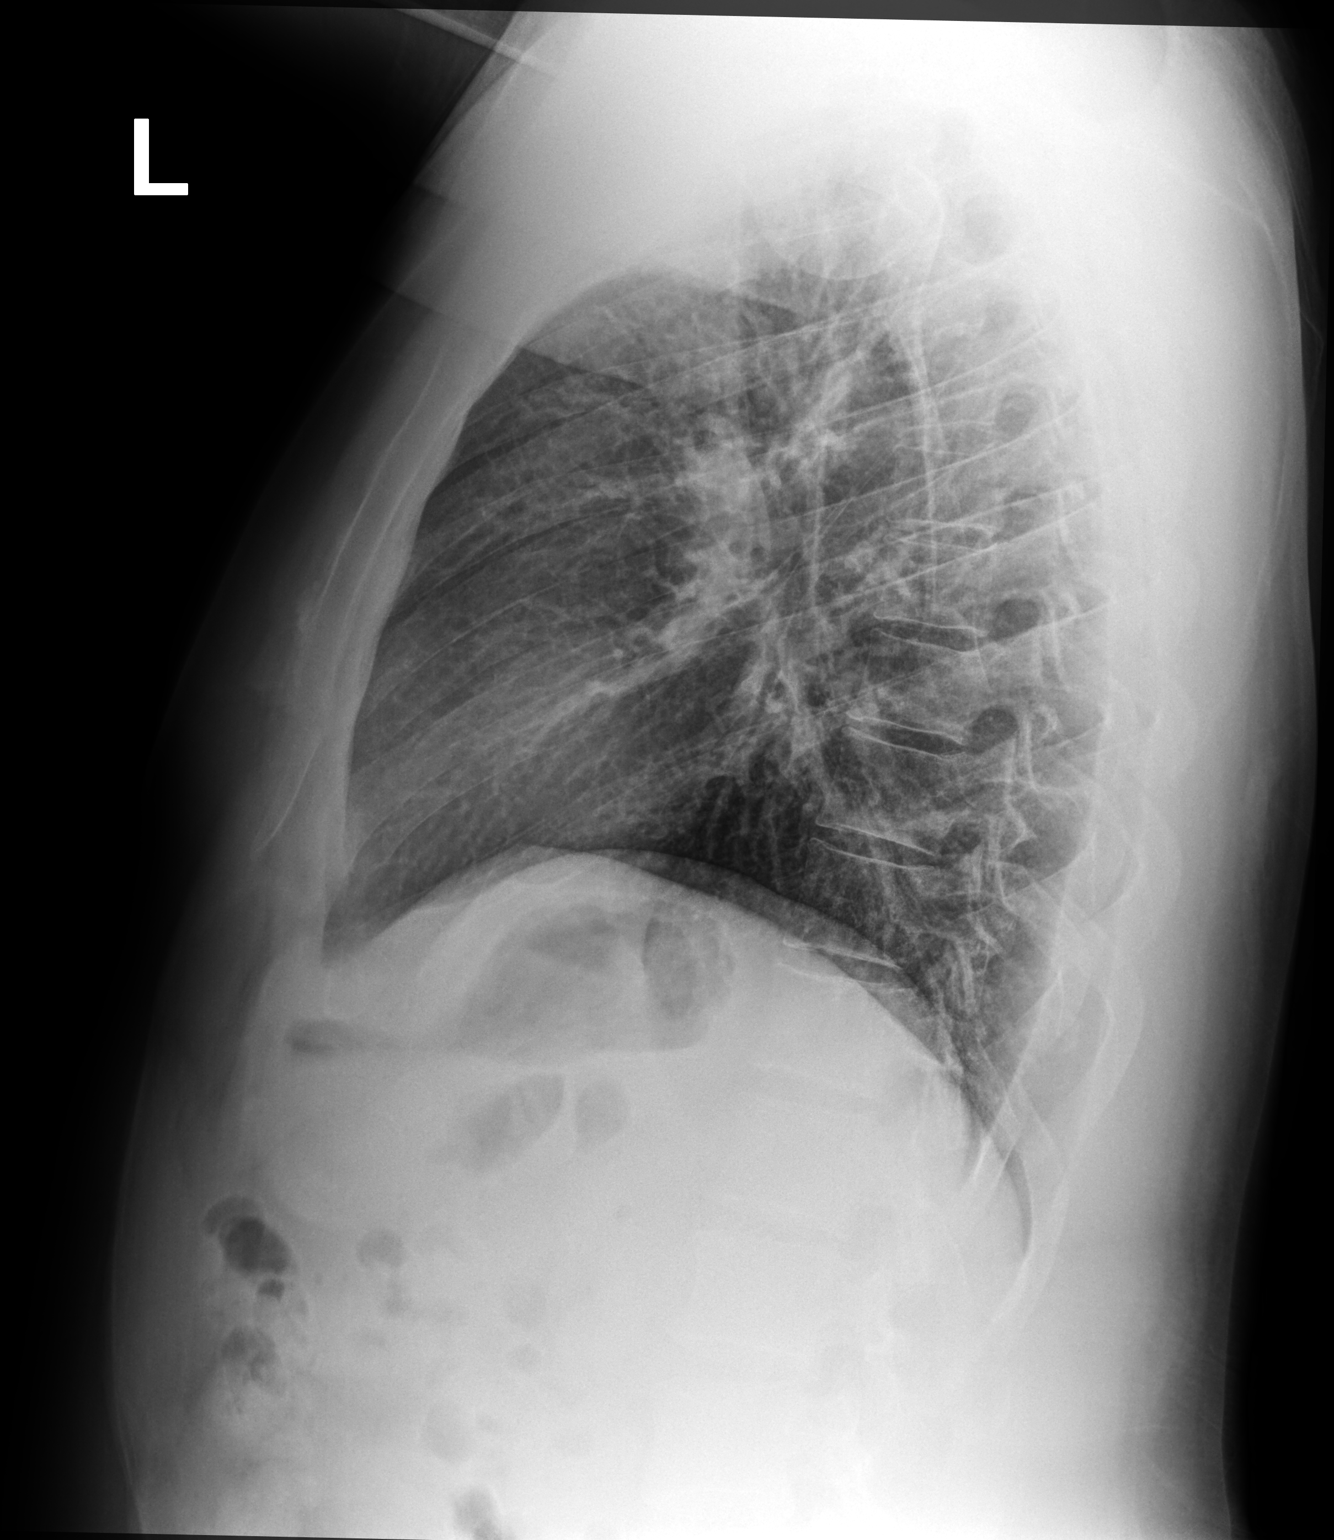

[2 of 2 positions shown; findings below may reference images not displayed]

FINDINGS: Lungs are clear. Heart size and pulmonary vascularity are normal. No
adenopathy. No bone lesions.
IMPRESSION: No edema or consolidation.

## 2019-11-04 ENCOUNTER — Encounter: Payer: Self-pay | Admitting: Family Medicine

## 2019-11-04 ENCOUNTER — Other Ambulatory Visit: Payer: Self-pay

## 2019-11-04 ENCOUNTER — Ambulatory Visit (INDEPENDENT_AMBULATORY_CARE_PROVIDER_SITE_OTHER): Payer: 59 | Admitting: Family Medicine

## 2019-11-04 VITALS — BP 123/86 | HR 72 | Temp 98.2°F | Ht 73.0 in | Wt 221.0 lb

## 2019-11-04 DIAGNOSIS — R739 Hyperglycemia, unspecified: Secondary | ICD-10-CM | POA: Diagnosis not present

## 2019-11-04 DIAGNOSIS — K30 Functional dyspepsia: Secondary | ICD-10-CM | POA: Diagnosis not present

## 2019-11-04 DIAGNOSIS — Z13220 Encounter for screening for lipoid disorders: Secondary | ICD-10-CM

## 2019-11-04 DIAGNOSIS — Z0000 Encounter for general adult medical examination without abnormal findings: Secondary | ICD-10-CM

## 2019-11-04 DIAGNOSIS — Z0001 Encounter for general adult medical examination with abnormal findings: Secondary | ICD-10-CM

## 2019-11-04 DIAGNOSIS — Z23 Encounter for immunization: Secondary | ICD-10-CM | POA: Diagnosis not present

## 2019-11-04 DIAGNOSIS — B351 Tinea unguium: Secondary | ICD-10-CM | POA: Diagnosis not present

## 2019-11-04 DIAGNOSIS — R1013 Epigastric pain: Secondary | ICD-10-CM | POA: Diagnosis not present

## 2019-11-04 NOTE — Patient Instructions (Addendum)
It was very nice to see you today!  We will check blood work today.  We will also give you a flu vaccine.  Please let me know if your neck pain returns.  I will place a referral for you to the GI specialist.  I will also place referral for you to see the dermatologist.  I will see you back in year for your next physical. Please come back to see me sooner if needed.   Take care, Dr Jerline Pain  Please try these tips to maintain a healthy lifestyle:   Eat at least 3 REAL meals and 1-2 snacks per day.  Aim for no more than 5 hours between eating.  If you eat breakfast, please do so within one hour of getting up.    Each meal should contain half fruits/vegetables, one quarter protein, and one quarter carbs (no bigger than a computer mouse)   Cut down on sweet beverages. This includes juice, soda, and sweet tea.     Drink at least 1 glass of water with each meal and aim for at least 8 glasses per day   Exercise at least 150 minutes every week.    Preventive Care 64-41 Years Old, Male Preventive care refers to lifestyle choices and visits with your health care provider that can promote health and wellness. This includes:  A yearly physical exam. This is also called an annual well check.  Regular dental and eye exams.  Immunizations.  Screening for certain conditions.  Healthy lifestyle choices, such as eating a healthy diet, getting regular exercise, not using drugs or products that contain nicotine and tobacco, and limiting alcohol use. What can I expect for my preventive care visit? Physical exam Your health care provider will check:  Height and weight. These may be used to calculate body mass index (BMI), which is a measurement that tells if you are at a healthy weight.  Heart rate and blood pressure.  Your skin for abnormal spots. Counseling Your health care provider may ask you questions about:  Alcohol, tobacco, and drug use.  Emotional well-being.  Home and  relationship well-being.  Sexual activity.  Eating habits.  Work and work Statistician. What immunizations do I need?  Influenza (flu) vaccine  This is recommended every year. Tetanus, diphtheria, and pertussis (Tdap) vaccine  You may need a Td booster every 10 years. Varicella (chickenpox) vaccine  You may need this vaccine if you have not already been vaccinated. Human papillomavirus (HPV) vaccine  If recommended by your health care provider, you may need three doses over 6 months. Measles, mumps, and rubella (MMR) vaccine  You may need at least one dose of MMR. You may also need a second dose. Meningococcal conjugate (MenACWY) vaccine  One dose is recommended if you are 34-65 years old and a Market researcher living in a residence hall, or if you have one of several medical conditions. You may also need additional booster doses. Pneumococcal conjugate (PCV13) vaccine  You may need this if you have certain conditions and were not previously vaccinated. Pneumococcal polysaccharide (PPSV23) vaccine  You may need one or two doses if you smoke cigarettes or if you have certain conditions. Hepatitis A vaccine  You may need this if you have certain conditions or if you travel or work in places where you may be exposed to hepatitis A. Hepatitis B vaccine  You may need this if you have certain conditions or if you travel or work in places where you may  be exposed to hepatitis B. Haemophilus influenzae type b (Hib) vaccine  You may need this if you have certain risk factors. You may receive vaccines as individual doses or as more than one vaccine together in one shot (combination vaccines). Talk with your health care provider about the risks and benefits of combination vaccines. What tests do I need? Blood tests  Lipid and cholesterol levels. These may be checked every 5 years starting at age 60.  Hepatitis C test.  Hepatitis B test. Screening   Diabetes  screening. This is done by checking your blood sugar (glucose) after you have not eaten for a while (fasting).  Sexually transmitted disease (STD) testing. Talk with your health care provider about your test results, treatment options, and if necessary, the need for more tests. Follow these instructions at home: Eating and drinking   Eat a diet that includes fresh fruits and vegetables, whole grains, lean protein, and low-fat dairy products.  Take vitamin and mineral supplements as recommended by your health care provider.  Do not drink alcohol if your health care provider tells you not to drink.  If you drink alcohol: ? Limit how much you have to 0-2 drinks a day. ? Be aware of how much alcohol is in your drink. In the [place] one drink equals one 12 oz bottle of beer (355 mL), one 5 oz glass of wine (148 mL), or one 1 oz glass of hard liquor (44 mL). Lifestyle  Take daily care of your teeth and gums.  Stay active. Exercise for at least 30 minutes on 5 or more days each week.  Do not use any products that contain nicotine or tobacco, such as cigarettes, e-cigarettes, and chewing tobacco. If you need help quitting, ask your health care provider.  If you are sexually active, practice safe sex. Use a condom or other form of protection to prevent STIs (sexually transmitted infections). What's next?  Go to your health care provider once a year for a well check visit.  Ask your health care provider how often you should have your eyes and teeth checked.  Stay up to date on all vaccines. This information is not intended to replace advice given to you by your health care provider. Make sure you discuss any questions you have with your health care provider. Document Revised: 02/14/2018 Document Reviewed: 02/14/2018 Elsevier Patient Education  2020 [employer].

## 2019-11-04 NOTE — Progress Notes (Signed)
Chief Complaint:  Francisco Elliott is a 39 y.o. male who presents today for his annual comprehensive physical exam.    Assessment/Plan:  Other Problems addressed today: Neck Pain Symptoms have mostly resolved.  Possibly related to sleep position.  Seems to be doing better now.  Discussed reasons to return to care.  Continue conservative management.  Epigastric Pain / Indigestion Symptoms improved modestly with Protonix last year but have worsened recently.  Will place referral to GI for further evaluation and per patient request. Has had CXR and EKG done in ED recently which were within normal limits.   Onychomycosis Has failed terbinafine. Will place referral to dermatology.   Body mass index is 29.16 kg/m. / Overweight  BMI Metric Follow Up - 11/04/19 1200      BMI Metric Follow Up-Please document annually   BMI Metric Follow Up Education provided            Preventative Healthcare: Flu vaccine given today.  Check CBC, CMET, TSH, lipid panel, A1c.  Patient Counseling(The following topics were reviewed and/or handout was given):  -Nutrition: Stressed importance of moderation in sodium/caffeine intake, saturated fat and cholesterol, caloric balance, sufficient intake of fresh fruits, vegetables, and fiber.  -Stressed the importance of regular exercise.   -Substance Abuse: Discussed cessation/primary prevention of tobacco, alcohol, or other drug use; driving or other dangerous activities under the influence; availability of treatment for abuse.   -Injury prevention: Discussed safety belts, safety helmets, smoke detector, smoking near bedding or upholstery.   -Sexuality: Discussed sexually transmitted diseases, partner selection, use of condoms, avoidance of unintended pregnancy and contraceptive alternatives.   -Dental health: Discussed importance of regular tooth brushing, flossing, and dental visits.  -Health maintenance and immunizations reviewed. Please refer to Health  maintenance section.  Return to care in 1 year for next preventative visit.     Subjective:  HPI:  He has a few issues he would like to discuss today:  Toenail fungus has not improved with terbinafine.   Still having intermittent chest pain. Located in between chest and back. Feels like a pressure sensation. This has been going for over a year. Seems to be getting worse. Pain comes and goes randomly. He tried acid blocker last year which helped modestly. Able to exercise without exacerbating symptoms.   Has also had some neck pain for the last couple of weeks. Made it worse with head turning. Tried taking tylenol and aspirin which did not help. No weakness or numbness. Symptoms have resolved.   Lifestyle Diet: Balanced. Taking plenty of water and fruits and vegetables.  Exercise: Limited. Does Taw [place]won Do.   Depression screen [facility] 2/9 05/30/2018  Decreased Interest 3  Down, Depressed, Hopeless 0  PHQ - 2 Score 3  Altered sleeping 3  Tired, decreased energy 3  Change in appetite 0  Feeling bad or failure about yourself  0  Trouble concentrating 0  Moving slowly or fidgety/restless 2  Suicidal thoughts 0  PHQ-9 Score 11  Difficult doing work/chores Somewhat difficult    Health Maintenance Due  Topic Date Due  . Hepatitis C Screening  Never done  . INFLUENZA VACCINE  10/05/2019     ROS: Per HPI, otherwise a complete review of systems was negative.   PMH:  The following were reviewed and entered/updated in epic: History reviewed. No pertinent past medical history. Patient Active Problem List   Diagnosis Date Noted  . Hyperglycemia 11/04/2019  . Allergic rhinitis 09/12/2018   Past Surgical  History:  Procedure Laterality Date  . NASAL SINUS SURGERY  2007  . SEPTOPLASTY  07/04/2005    Family History  Problem Relation Age of Onset  . Hypertension Mother     Medications- reviewed and updated Current Outpatient Medications  Medication Sig Dispense Refill  .  Albuterol Sulfate (PROAIR RESPICLICK) 108 (90 Base) MCG/ACT AEPB Inhale 2 puffs into the lungs every 4 (four) hours as needed (shortness of breath). 1 each 2  . cetirizine (ZYRTEC) 10 MG tablet Take 1 tablet (10 mg total) by mouth daily. 30 tablet 0  . fluticasone (FLONASE) 50 MCG/ACT nasal spray Place 2 sprays into both nostrils daily. 16 g 0   No current facility-administered medications for this visit.    Allergies-reviewed and updated No Known Allergies  Social History   Socioeconomic History  . Marital status: Married    Spouse name: Not on file  . Number of children: 4  . Years of education: Not on file  . Highest education level: Not on file  Occupational History  . Occupation: Radio producer   Tobacco Use  . Smoking status: Never Smoker  . Smokeless tobacco: Never Used  Vaping Use  . Vaping Use: Never used  Substance and Sexual Activity  . Alcohol use: No  . Drug use: No  . Sexual activity: Yes  Other Topics Concern  . Not on file  Social History Narrative  . Not on file   Social Determinants of Health   Financial Resource Strain:   . Difficulty of Paying Living Expenses: Not on file  Food Insecurity:   . Worried About Programme researcher, broadcasting/film/video in the Last Year: Not on file  . Ran Out of Food in the Last Year: Not on file  Transportation Needs:   . Lack of Transportation (Medical): Not on file  . Lack of Transportation (Non-Medical): Not on file  Physical Activity:   . Days of Exercise per Week: Not on file  . Minutes of Exercise per Session: Not on file  Stress:   . Feeling of Stress : Not on file  Social Connections:   . Frequency of Communication with Friends and Family: Not on file  . Frequency of Social Gatherings with Friends and Family: Not on file  . Attends Religious Services: Not on file  . Active Member of Clubs or Organizations: Not on file  . Attends Banker Meetings: Not on file  . Marital Status: Not on file         Objective:  Physical Exam: BP 123/86   Pulse 72   Temp 98.2 F (36.8 C) (Temporal)   Ht 6\' 1"  (1.854 m)   Wt 221 lb (100.2 kg)   SpO2 96%   BMI 29.16 kg/m   Body mass index is 29.16 kg/m. Wt Readings from Last 3 Encounters:  11/04/19 221 lb (100.2 kg)  06/03/19 242 lb 8.1 oz (110 kg)  09/12/18 228 lb 12.8 oz (103.8 kg)   Gen: NAD, resting comfortably HEENT: TMs normal bilaterally. OP clear. No thyromegaly noted.  CV: RRR with no murmurs appreciated Pulm: NWOB, CTAB with no crackles, wheezes, or rhonchi GI: Normal bowel sounds present. Soft, Nontender, Nondistended. MSK: no edema, cyanosis, or clubbing noted Skin: warm, dry Neuro: CN2-12 grossly intact. Strength 5/5 in upper and lower extremities. Reflexes symmetric and intact bilaterally.  Psych: Normal affect and thought content     Stokes Rattigan M. 11/13/18, MD 11/04/2019 12:00 PM

## 2019-11-05 LAB — COMPREHENSIVE METABOLIC PANEL
AG Ratio: 1.7 (calc) (ref 1.0–2.5)
ALT: 19 U/L (ref 9–46)
AST: 19 U/L (ref 10–40)
Albumin: 4.3 g/dL (ref 3.6–5.1)
Alkaline phosphatase (APISO): 93 U/L (ref 36–130)
BUN: 10 mg/dL (ref 7–25)
CO2: 26 mmol/L (ref 20–32)
Calcium: 9.4 mg/dL (ref 8.6–10.3)
Chloride: 103 mmol/L (ref 98–110)
Creat: 0.91 mg/dL (ref 0.60–1.35)
Globulin: 2.5 g/dL (calc) (ref 1.9–3.7)
Glucose, Bld: 93 mg/dL (ref 65–99)
Potassium: 4.1 mmol/L (ref 3.5–5.3)
Sodium: 139 mmol/L (ref 135–146)
Total Bilirubin: 0.8 mg/dL (ref 0.2–1.2)
Total Protein: 6.8 g/dL (ref 6.1–8.1)

## 2019-11-05 LAB — HEMOGLOBIN A1C
Hgb A1c MFr Bld: 5.7 % of total Hgb — ABNORMAL HIGH (ref <5.7)
Mean Plasma Glucose: 117 (calc)
eAG (mmol/L): 6.5 (calc)

## 2019-11-05 LAB — LIPID PANEL
Cholesterol: 172 mg/dL (ref <200)
HDL: 46 mg/dL (ref >=40)
LDL Cholesterol (Calc): 107 mg/dL (calc) — ABNORMAL HIGH
Non-HDL Cholesterol (Calc): 126 mg/dL (calc) (ref <130)
Total CHOL/HDL Ratio: 3.7 (calc) (ref <5.0)
Triglycerides: 98 mg/dL (ref <150)

## 2019-11-05 LAB — CBC
HCT: 42.6 % (ref 38.5–50.0)
Hemoglobin: 14.6 g/dL (ref 13.2–17.1)
MCH: 30.1 pg (ref 27.0–33.0)
MCHC: 34.3 g/dL (ref 32.0–36.0)
MCV: 87.8 fL (ref 80.0–100.0)
MPV: 9 fL (ref 7.5–12.5)
Platelets: 277 10*3/uL (ref 140–400)
RBC: 4.85 10*6/uL (ref 4.20–5.80)
RDW: 13.1 % (ref 11.0–15.0)
WBC: 8.1 10*3/uL (ref 3.8–10.8)

## 2019-11-05 LAB — TSH: TSH: 1.31 mIU/L (ref 0.40–4.50)

## 2019-11-05 NOTE — Progress Notes (Signed)
Please inform patient of the following:  Blood sugar and cholesterol levels are borderline but stable. Everything else is NORMAL. Do not need to make any changes to his treatment plan at this time. Would like for him to keep working on diet and exercise and we can recheck in a year.  Katina Degree. Jimmey Ralph, MD 11/05/2019 9:25 AM

## 2020-04-06 ENCOUNTER — Ambulatory Visit: Payer: 59 | Attending: Internal Medicine

## 2020-04-06 DIAGNOSIS — Z23 Encounter for immunization: Secondary | ICD-10-CM

## 2020-04-06 NOTE — Progress Notes (Signed)
   Covid-19 Vaccination Clinic  Name:  Francisco Elliott    MRN: [id number] DOB: [birth year]  04/06/2020  [name] was observed post Covid-19 immunization for 15 minutes without incident. He was provided with Vaccine Information Sheet and instruction to access the V-Safe system.   [name] was instructed to call 911 with any severe reactions post vaccine: Marland Kitchen Difficulty breathing  . Swelling of face and throat  . A fast heartbeat  . A bad rash all over body  . Dizziness and weakness   Immunizations Administered    Name Date Dose VIS Date Route   PFIZER Comrnaty(Gray TOP) Covid-19 Vaccine 04/06/2020  2:02 PM 0.3 mL 02/12/2020 Intramuscular   Manufacturer: [employer], [employer]   Lot: [id number]   NDC: [phone]

## 2020-04-16 ENCOUNTER — Encounter: Payer: Self-pay | Admitting: Family Medicine

## 2020-04-16 ENCOUNTER — Telehealth: Payer: 59 | Admitting: Family Medicine

## 2020-04-16 DIAGNOSIS — J309 Allergic rhinitis, unspecified: Secondary | ICD-10-CM

## 2020-04-16 MED ORDER — CETIRIZINE HCL 10 MG PO TABS: 10.0000 mg | ORAL_TABLET | Freq: Every day | ORAL | 1 refills | Status: AC

## 2020-04-16 MED ORDER — FLUTICASONE PROPIONATE 50 MCG/ACT NA SUSP: 2.0000 | Freq: Every day | NASAL | 1 refills | Status: DC

## 2020-04-16 NOTE — Progress Notes (Signed)
Francisco Elliott, Francisco Elliott are scheduled for a virtual visit with your provider today.    Just as we do with appointments in the office, we must obtain your consent to participate.  Your consent will be active for this visit and any virtual visit you may have with one of our providers in the next 365 days.    If you have a MyChart account, I can also send a copy of this consent to you electronically.  All virtual visits are billed to your insurance company just like a traditional visit in the office.  As this is a virtual visit, video technology does not allow for your provider to perform a traditional examination.  This may limit your provider's ability to fully assess your condition.  If your provider identifies any concerns that need to be evaluated in person or the need to arrange testing such as labs, EKG, etc, we will make arrangements to do so.    Although advances in technology are sophisticated, we cannot ensure that it will always work on either your end or our end.  If the connection with a video visit is poor, we may have to switch to a telephone visit.  With either a video or telephone visit, we are not always able to ensure that we have a secure connection.   I need to obtain your verbal consent now.   Are you willing to proceed with your visit today?   Francisco Elliott has provided verbal consent on 04/16/2020 for a virtual visit (video or telephone).   Francisco Finner, NP 04/16/2020  12:11 PM   Date:  04/16/2020   ID:  Francisco Elliott, DOB 11/16/1980, MRN [id number]  Patient Location: Home Provider Location: Home Office   Participants: Patient and Provider for Visit and Wrap up  Method of visit: Video  Location of Patient: Home Location of Provider: Home Office Consent was obtain for visit over the video. Services rendered by provider: Visit was performed via video  A video enabled telemedicine application was used and I verified that I am speaking with the correct person using two  identifiers.  PCP:  Francisco Dark, MD   Chief Complaint:  On going sinus issues   History of Present Illness:    Francisco Elliott is a 40 y.o. male with history as stated below. Presents video telehealth for an acute care visit for on going sinus issues.  Onset of symptoms was Jan 16 th for symptoms have been persistent and include:     Sneezing, achiness, fatigue, at times itchy watery eyes (especially after sneezing). Nasal congestion - clear drainage, mild yellow at times with some blood after blowing it a lot.  Of note: sas getting better and got booster shot on 04/06/2020. Started all over again. Is looking to get COVID test today or tomorrow.  Denies cough and shortness of breath, chest pain.  Denies ear pain and throat pain. Denies headaches, pressure when sneezing  Reports fever and chills after booster- but went away.   No other sick contacts. Has 4 children and no one has developed the same symptoms as him. He does wonder if he is allergic to something.   Has tried mucinex and tylenol sinus, but not his allergy meds. He does report being worse when outside  Past Medical, Surgical, Social History, Allergies, and Medications have been Reviewed.  No past medical history on file.  Current Meds  Medication Sig  . cetirizine (ZYRTEC) 10 MG tablet Take 1 tablet (  10 mg total) by mouth daily.  . fluticasone (FLONASE) 50 MCG/ACT nasal spray Place 2 sprays into both nostrils daily.     Allergies:   Patient has no known allergies.   ROS See HPI for history of present illness.  Physical Exam Constitutional:      Appearance: Normal appearance.  HENT:     Head: Normocephalic and atraumatic.     Comments: No tenderness to touch - pt palpitated during visit    Right Ear: External ear normal.     Left Ear: External ear normal.     Nose: Nose normal.  Eyes:     Extraocular Movements: Extraocular movements intact.     Conjunctiva/sclera: Conjunctivae normal.     Pupils:  Pupils are equal, round, and reactive to light.  Pulmonary:     Breath sounds: Normal breath sounds.     Comments: No shortness of breath or cough in conversation  Musculoskeletal:        General: Normal range of motion.     Cervical back: Normal range of motion.  Neurological:     Mental Status: He is alert and oriented to person, place, and time.  Psychiatric:        Mood and Affect: Mood normal.        Behavior: Behavior normal.        Thought Content: Thought content normal.        Judgment: Judgment normal.               1. Allergic rhinitis, unspecified seasonality, unspecified trigger -COVID testing recommended -S&S are consistent with allergies recommended to restart his allergy medications refill provided today -if COVID + would explain immune response post booster. -if neg, still can not rule out previous Jan exposure.  -encouraged OTC treatments as discussed -pt verbalized understanding   - cetirizine (ZYRTEC) 10 MG tablet; Take 1 tablet (10 mg total) by mouth daily.  Dispense: 90 tablet; Refill: 1 - fluticasone (FLONASE) 50 MCG/ACT nasal spray; Place 2 sprays into both nostrils daily.  Dispense: 16 g; Refill: 1    Time:   Today, I have spent 15 minutes with the patient with telehealth technology discussing the above problems, reviewing the chart, previous notes, medications and orders.    Tests Ordered: No orders of the defined types were placed in this encounter.   Medication Changes: Meds ordered this encounter  Medications  . cetirizine (ZYRTEC) 10 MG tablet    Sig: Take 1 tablet (10 mg total) by mouth daily.    Dispense:  90 tablet    Refill:  1    Order Specific Question:   Supervising Provider    Answer:   SIMPSON, MARGARET E [[id number]]  . fluticasone (FLONASE) 50 MCG/ACT nasal spray    Sig: Place 2 sprays into both nostrils daily.    Dispense:  16 g    Refill:  1    Order Specific Question:   Supervising Provider    Answer:   Kerri Perches  [[id number]]     Disposition:  Follow up as needed  Signed, Francisco Finner, NP  04/16/2020 12:11 PM

## 2020-04-16 NOTE — Patient Instructions (Signed)
I appreciate the opportunity to provide you with care for your health and wellness.  Follow up: as needed  Covid test recommended  Restart allergy medications, consider allergy testing if not improved or limited improvement  Please continue to practice social distancing to keep you, your family, and our community safe.  If you must go out, please wear a mask and practice good handwashing.

## 2020-06-03 ENCOUNTER — Encounter: Payer: Self-pay | Admitting: Family Medicine

## 2020-07-03 IMAGING — CR DG CHEST 2V
2 series · 2 of 2 positions shown · non-contrast
Comparison: 09/12/2018

CLINICAL DATA: Central chest pain radiating to back

EXAM:
CHEST - 2 VIEW

[chest pa]
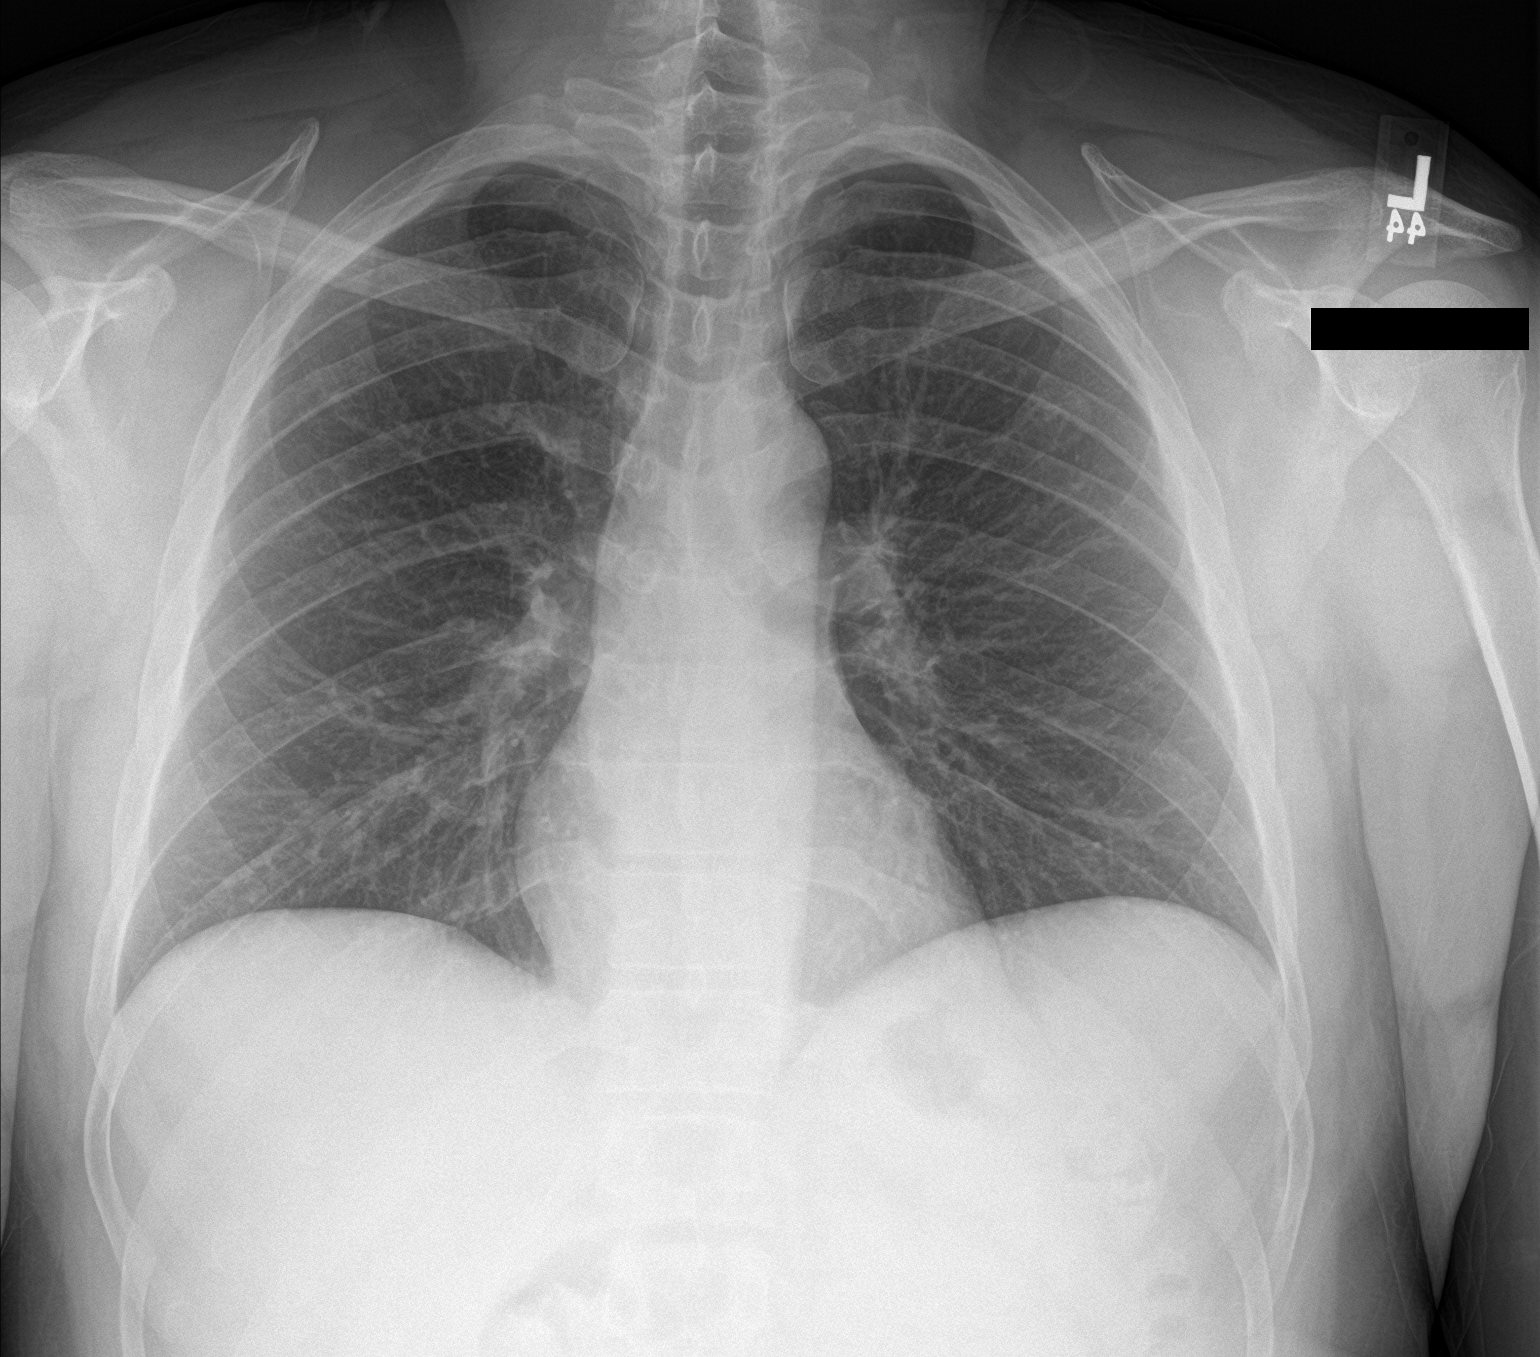

[chest lat]
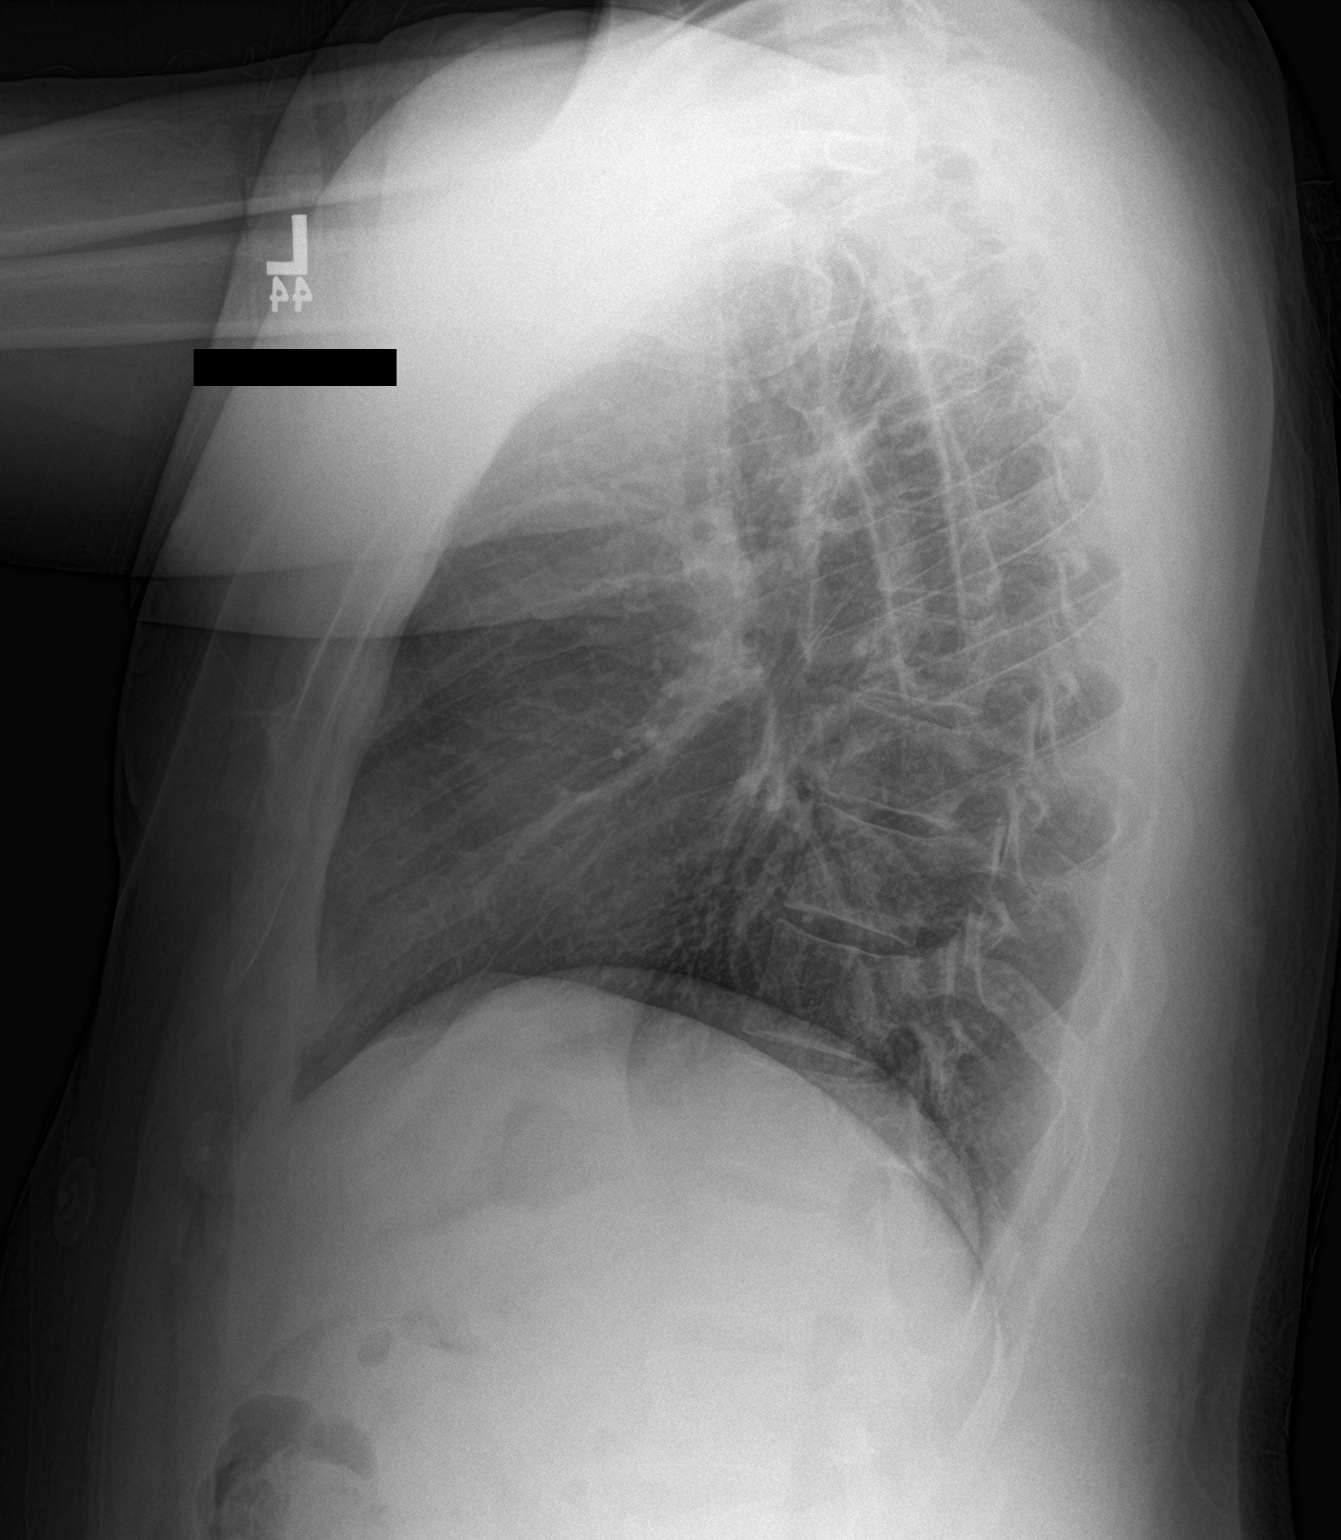

[2 of 2 positions shown; findings below may reference images not displayed]

FINDINGS: The heart size and mediastinal contours are within normal limits.
Both lungs are clear. The visualized skeletal structures are
unremarkable.
IMPRESSION: No active cardiopulmonary disease.

## 2020-08-22 ENCOUNTER — Other Ambulatory Visit: Payer: Self-pay | Admitting: Family Medicine

## 2020-08-22 DIAGNOSIS — J309 Allergic rhinitis, unspecified: Secondary | ICD-10-CM

## 2020-09-07 ENCOUNTER — Other Ambulatory Visit: Payer: Self-pay | Admitting: Nurse Practitioner

## 2020-09-07 DIAGNOSIS — J309 Allergic rhinitis, unspecified: Secondary | ICD-10-CM

## 2021-02-14 ENCOUNTER — Encounter: Payer: Self-pay | Admitting: Family Medicine

## 2021-11-28 ENCOUNTER — Encounter: Payer: Self-pay | Admitting: *Deleted

## 2022-02-16 ENCOUNTER — Encounter: Payer: Self-pay | Admitting: *Deleted
